# Patient Record
Sex: Female | Born: 1975 | Race: Black or African American | Hispanic: No | Marital: Married | Smoking: Never smoker
Health system: Southern US, Community
[De-identification: ages and names within clinical notes are randomized; demographics above are authoritative.]

## PROBLEM LIST (undated history)

## (undated) DIAGNOSIS — R87619 Unspecified abnormal cytological findings in specimens from cervix uteri: Secondary | ICD-10-CM

## (undated) DIAGNOSIS — D509 Iron deficiency anemia, unspecified: Secondary | ICD-10-CM

## (undated) DIAGNOSIS — IMO0002 Reserved for concepts with insufficient information to code with codable children: Secondary | ICD-10-CM

## (undated) DIAGNOSIS — I10 Essential (primary) hypertension: Secondary | ICD-10-CM

## (undated) DIAGNOSIS — Z8759 Personal history of other complications of pregnancy, childbirth and the puerperium: Secondary | ICD-10-CM

## (undated) HISTORY — PX: ABDOMINAL HYSTERECTOMY: SHX81

## (undated) HISTORY — PX: EYE SURGERY: SHX253

## (undated) HISTORY — PX: TUBAL LIGATION: SHX77

---

## 2001-05-11 ENCOUNTER — Other Ambulatory Visit: Admission: RE | Admit: 2001-05-11 | Discharge: 2001-05-11 | Payer: Self-pay | Admitting: Obstetrics and Gynecology

## 2003-01-14 ENCOUNTER — Emergency Department (HOSPITAL_COMMUNITY): Admission: EM | Admit: 2003-01-14 | Discharge: 2003-01-14 | Payer: Self-pay | Admitting: Emergency Medicine

## 2003-01-14 ENCOUNTER — Encounter: Payer: Self-pay | Admitting: Emergency Medicine

## 2003-01-17 ENCOUNTER — Emergency Department (HOSPITAL_COMMUNITY): Admission: EM | Admit: 2003-01-17 | Discharge: 2003-01-17 | Payer: Self-pay | Admitting: Emergency Medicine

## 2006-08-06 ENCOUNTER — Emergency Department (HOSPITAL_COMMUNITY): Admission: EM | Admit: 2006-08-06 | Discharge: 2006-08-06 | Payer: Self-pay | Admitting: Emergency Medicine

## 2007-09-15 ENCOUNTER — Emergency Department (HOSPITAL_COMMUNITY): Admission: EM | Admit: 2007-09-15 | Discharge: 2007-09-15 | Payer: Self-pay | Admitting: *Deleted

## 2007-10-28 ENCOUNTER — Encounter: Admission: RE | Admit: 2007-10-28 | Discharge: 2007-10-28 | Payer: Self-pay | Admitting: Occupational Medicine

## 2009-03-19 ENCOUNTER — Emergency Department (HOSPITAL_COMMUNITY): Admission: EM | Admit: 2009-03-19 | Discharge: 2009-03-19 | Payer: Self-pay | Admitting: Emergency Medicine

## 2010-03-07 ENCOUNTER — Ambulatory Visit: Payer: Self-pay | Admitting: Nurse Practitioner

## 2010-03-07 ENCOUNTER — Inpatient Hospital Stay (HOSPITAL_COMMUNITY)
Admission: AD | Admit: 2010-03-07 | Discharge: 2010-03-07 | Payer: Self-pay | Source: Home / Self Care | Admitting: Obstetrics and Gynecology

## 2010-07-29 ENCOUNTER — Inpatient Hospital Stay (HOSPITAL_COMMUNITY): Admission: AD | Admit: 2010-07-29 | Discharge: 2010-07-29 | Payer: Self-pay | Admitting: Obstetrics and Gynecology

## 2010-08-31 ENCOUNTER — Observation Stay (HOSPITAL_COMMUNITY)
Admission: AD | Admit: 2010-08-31 | Discharge: 2010-09-02 | Payer: Self-pay | Source: Home / Self Care | Attending: Obstetrics and Gynecology | Admitting: Obstetrics and Gynecology

## 2010-09-08 ENCOUNTER — Inpatient Hospital Stay (HOSPITAL_COMMUNITY)
Admission: AD | Admit: 2010-09-08 | Discharge: 2010-09-08 | Payer: Self-pay | Source: Home / Self Care | Attending: Obstetrics and Gynecology | Admitting: Obstetrics and Gynecology

## 2010-09-16 NOTE — L&D Delivery Note (Signed)
Delivery Note At 4:28 AM a viable female was delivered via Vaginal, Spontaneous Delivery (Presentation: ; Occiput Anterior).  APGAR:8 and 8 ; weight .  pending Placenta status: Intact, Spontaneous.  Cord:  with the following complications:   Anesthesia: None  Episiotomy: None Lacerations: None Suture Repair: n/a Est. Blood Loss (mL): 300 Specimen: cord blood and placenta Mom to AICU on Mag sulfate.  Baby to NICU.  Nikiya Starn C. 06/15/2011, 4:43 AM

## 2010-09-18 ENCOUNTER — Inpatient Hospital Stay (HOSPITAL_COMMUNITY)
Admission: AD | Admit: 2010-09-18 | Discharge: 2010-09-18 | Payer: Self-pay | Source: Home / Self Care | Attending: Obstetrics and Gynecology | Admitting: Obstetrics and Gynecology

## 2010-09-21 ENCOUNTER — Inpatient Hospital Stay (HOSPITAL_COMMUNITY)
Admission: AD | Admit: 2010-09-21 | Discharge: 2010-09-24 | Payer: Self-pay | Source: Home / Self Care | Attending: Obstetrics and Gynecology | Admitting: Obstetrics and Gynecology

## 2010-09-21 ENCOUNTER — Ambulatory Visit (HOSPITAL_COMMUNITY)
Admission: RE | Admit: 2010-09-21 | Discharge: 2010-09-21 | Payer: Self-pay | Source: Home / Self Care | Attending: Obstetrics and Gynecology | Admitting: Obstetrics and Gynecology

## 2010-09-21 LAB — COMPREHENSIVE METABOLIC PANEL
ALT: 14 U/L (ref 0–35)
AST: 22 U/L (ref 0–37)
Albumin: 3 g/dL — ABNORMAL LOW (ref 3.5–5.2)
Alkaline Phosphatase: 140 U/L — ABNORMAL HIGH (ref 39–117)
BUN: 6 mg/dL (ref 6–23)
CO2: 22 mEq/L (ref 19–32)
Calcium: 8.4 mg/dL (ref 8.4–10.5)
Chloride: 108 mEq/L (ref 96–112)
Creatinine, Ser: 0.56 mg/dL (ref 0.4–1.2)
GFR calc Af Amer: >60 mL/min (ref >60)
GFR calc non Af Amer: >60 mL/min (ref >60)
Glucose, Bld: 83 mg/dL (ref 70–99)
Potassium: 3.7 mEq/L (ref 3.5–5.1)
Sodium: 135 mEq/L (ref 135–145)
Total Bilirubin: 1.5 mg/dL — ABNORMAL HIGH (ref 0.3–1.2)
Total Protein: 5.9 g/dL — ABNORMAL LOW (ref 6.0–8.3)

## 2010-09-21 LAB — CBC
HCT: 36.7 % (ref 36.0–46.0)
Hemoglobin: 12.7 g/dL (ref 12.0–15.0)
MCH: 27.5 pg (ref 26.0–34.0)
MCHC: 34.6 g/dL (ref 30.0–36.0)
MCV: 79.4 fL (ref 78.0–100.0)
Platelets: 115 10*3/uL — ABNORMAL LOW (ref 150–400)
RBC: 4.62 MIL/uL (ref 3.87–5.11)
RDW: 14.7 % (ref 11.5–15.5)
WBC: 4.7 10*3/uL (ref 4.0–10.5)

## 2010-09-21 LAB — URIC ACID: Uric Acid, Serum: 5.9 mg/dL (ref 2.4–7.0)

## 2010-09-21 LAB — LACTATE DEHYDROGENASE: LDH: 169 U/L (ref 94–250)

## 2010-10-01 LAB — CBC
HCT: 37.7 % (ref 36.0–46.0)
HCT: 31.3 % — ABNORMAL LOW (ref 36.0–46.0)
HCT: 32.7 % — ABNORMAL LOW (ref 36.0–46.0)
HCT: 38.6 % (ref 36.0–46.0)
Hemoglobin: 13.4 g/dL (ref 12.0–15.0)
Hemoglobin: 10.8 g/dL — ABNORMAL LOW (ref 12.0–15.0)
Hemoglobin: 11.2 g/dL — ABNORMAL LOW (ref 12.0–15.0)
Hemoglobin: 13.3 g/dL (ref 12.0–15.0)
MCH: 28 pg (ref 26.0–34.0)
MCH: 27.3 pg (ref 26.0–34.0)
MCH: 27.3 pg (ref 26.0–34.0)
MCH: 27.4 pg (ref 26.0–34.0)
MCHC: 35.5 g/dL (ref 30.0–36.0)
MCHC: 34.3 g/dL (ref 30.0–36.0)
MCHC: 34.5 g/dL (ref 30.0–36.0)
MCHC: 34.5 g/dL (ref 30.0–36.0)
MCV: 78.7 fL (ref 78.0–100.0)
MCV: 79.3 fL (ref 78.0–100.0)
MCV: 79.4 fL (ref 78.0–100.0)
MCV: 79.8 fL (ref 78.0–100.0)
Platelets: 111 10*3/uL — ABNORMAL LOW (ref 150–400)
Platelets: 117 10*3/uL — ABNORMAL LOW (ref 150–400)
Platelets: 128 10*3/uL — ABNORMAL LOW (ref 150–400)
Platelets: 131 10*3/uL — ABNORMAL LOW (ref 150–400)
RBC: 4.79 MIL/uL (ref 3.87–5.11)
RBC: 3.94 MIL/uL (ref 3.87–5.11)
RBC: 4.1 MIL/uL (ref 3.87–5.11)
RBC: 4.87 MIL/uL (ref 3.87–5.11)
RDW: 14.7 % (ref 11.5–15.5)
RDW: 14.8 % (ref 11.5–15.5)
RDW: 14.9 % (ref 11.5–15.5)
RDW: 14.9 % (ref 11.5–15.5)
WBC: 7 10*3/uL (ref 4.0–10.5)
WBC: 12.6 10*3/uL — ABNORMAL HIGH (ref 4.0–10.5)
WBC: 13.4 10*3/uL — ABNORMAL HIGH (ref 4.0–10.5)
WBC: 8.2 10*3/uL (ref 4.0–10.5)

## 2010-10-01 LAB — COMPREHENSIVE METABOLIC PANEL
ALT: 12 U/L (ref 0–35)
ALT: 11 U/L (ref 0–35)
ALT: 13 U/L (ref 0–35)
AST: 19 U/L (ref 0–37)
AST: 18 U/L (ref 0–37)
AST: 22 U/L (ref 0–37)
Albumin: 2.8 g/dL — ABNORMAL LOW (ref 3.5–5.2)
Albumin: 2.6 g/dL — ABNORMAL LOW (ref 3.5–5.2)
Albumin: 2.8 g/dL — ABNORMAL LOW (ref 3.5–5.2)
Alkaline Phosphatase: 141 U/L — ABNORMAL HIGH (ref 39–117)
Alkaline Phosphatase: 114 U/L (ref 39–117)
Alkaline Phosphatase: 141 U/L — ABNORMAL HIGH (ref 39–117)
BUN: 5 mg/dL — ABNORMAL LOW (ref 6–23)
BUN: 4 mg/dL — ABNORMAL LOW (ref 6–23)
BUN: 5 mg/dL — ABNORMAL LOW (ref 6–23)
CO2: 21 mEq/L (ref 19–32)
CO2: 20 mEq/L (ref 19–32)
CO2: 23 mEq/L (ref 19–32)
Calcium: 7.9 mg/dL — ABNORMAL LOW (ref 8.4–10.5)
Calcium: 7.1 mg/dL — ABNORMAL LOW (ref 8.4–10.5)
Calcium: 7.4 mg/dL — ABNORMAL LOW (ref 8.4–10.5)
Chloride: 107 mEq/L (ref 96–112)
Chloride: 106 mEq/L (ref 96–112)
Chloride: 107 mEq/L (ref 96–112)
Creatinine, Ser: 0.54 mg/dL (ref 0.4–1.2)
Creatinine, Ser: 0.58 mg/dL (ref 0.4–1.2)
Creatinine, Ser: 0.62 mg/dL (ref 0.4–1.2)
GFR calc Af Amer: >60 mL/min (ref >60)
GFR calc Af Amer: >60 mL/min (ref >60)
GFR calc Af Amer: >60 mL/min (ref >60)
GFR calc non Af Amer: >60 mL/min (ref >60)
GFR calc non Af Amer: >60 mL/min (ref >60)
GFR calc non Af Amer: >60 mL/min (ref >60)
Glucose, Bld: 96 mg/dL (ref 70–99)
Glucose, Bld: 121 mg/dL — ABNORMAL HIGH (ref 70–99)
Glucose, Bld: 121 mg/dL — ABNORMAL HIGH (ref 70–99)
Potassium: 3.5 mEq/L (ref 3.5–5.1)
Potassium: 3.6 mEq/L (ref 3.5–5.1)
Potassium: 4.1 mEq/L (ref 3.5–5.1)
Sodium: 134 mEq/L — ABNORMAL LOW (ref 135–145)
Sodium: 133 mEq/L — ABNORMAL LOW (ref 135–145)
Sodium: 134 mEq/L — ABNORMAL LOW (ref 135–145)
Total Bilirubin: 1 mg/dL (ref 0.3–1.2)
Total Bilirubin: 1 mg/dL (ref 0.3–1.2)
Total Bilirubin: 1.3 mg/dL — ABNORMAL HIGH (ref 0.3–1.2)
Total Protein: 5.8 g/dL — ABNORMAL LOW (ref 6.0–8.3)
Total Protein: 4.8 g/dL — ABNORMAL LOW (ref 6.0–8.3)
Total Protein: 6.1 g/dL (ref 6.0–8.3)

## 2010-10-01 LAB — MRSA PCR SCREENING: MRSA by PCR: NEGATIVE

## 2010-10-01 LAB — URIC ACID: Uric Acid, Serum: 5.6 mg/dL (ref 2.4–7.0)

## 2010-10-01 LAB — LACTATE DEHYDROGENASE: LDH: 159 U/L (ref 94–250)

## 2010-10-01 LAB — MAGNESIUM
Magnesium: 4.8 mg/dL — ABNORMAL HIGH (ref 1.5–2.5)
Magnesium: 6.2 mg/dL (ref 1.5–2.5)

## 2010-10-01 LAB — ABO/RH: ABO/RH(D): O POS

## 2010-10-01 LAB — RPR: RPR Ser Ql: NONREACTIVE

## 2010-10-17 ENCOUNTER — Encounter: Payer: Self-pay | Admitting: Obstetrics and Gynecology

## 2010-11-26 LAB — CBC
HCT: 35.1 % — ABNORMAL LOW (ref 36.0–46.0)
HCT: 36.5 % (ref 36.0–46.0)
HCT: 37.3 % (ref 36.0–46.0)
Hemoglobin: 12.2 g/dL (ref 12.0–15.0)
Hemoglobin: 12.4 g/dL (ref 12.0–15.0)
MCH: 27 pg (ref 26.0–34.0)
MCH: 27.3 pg (ref 26.0–34.0)
MCHC: 33.8 g/dL (ref 30.0–36.0)
MCHC: 34.8 g/dL (ref 30.0–36.0)
MCV: 78.5 fL (ref 78.0–100.0)
Platelets: 140 10*3/uL — ABNORMAL LOW (ref 150–400)
Platelets: 149 10*3/uL — ABNORMAL LOW (ref 150–400)
RBC: 4.47 MIL/uL (ref 3.87–5.11)
RBC: 4.6 MIL/uL (ref 3.87–5.11)
RDW: 13.9 % (ref 11.5–15.5)
RDW: 14.3 % (ref 11.5–15.5)
WBC: 4.8 10*3/uL (ref 4.0–10.5)
WBC: 5.5 10*3/uL (ref 4.0–10.5)

## 2010-11-26 LAB — URINE MICROSCOPIC-ADD ON

## 2010-11-26 LAB — COMPREHENSIVE METABOLIC PANEL
ALT: 11 U/L (ref 0–35)
ALT: 13 U/L (ref 0–35)
ALT: 14 U/L (ref 0–35)
AST: 19 U/L (ref 0–37)
AST: 19 U/L (ref 0–37)
Albumin: 3 g/dL — ABNORMAL LOW (ref 3.5–5.2)
Albumin: 3.1 g/dL — ABNORMAL LOW (ref 3.5–5.2)
Alkaline Phosphatase: 80 U/L (ref 39–117)
Alkaline Phosphatase: 92 U/L (ref 39–117)
BUN: 8 mg/dL (ref 6–23)
CO2: 20 mEq/L (ref 19–32)
CO2: 23 mEq/L (ref 19–32)
Calcium: 8.6 mg/dL (ref 8.4–10.5)
Calcium: 8.9 mg/dL (ref 8.4–10.5)
Chloride: 105 mEq/L (ref 96–112)
Chloride: 107 mEq/L (ref 96–112)
Creatinine, Ser: 0.48 mg/dL (ref 0.4–1.2)
GFR calc Af Amer: >60 mL/min (ref >60)
GFR calc Af Amer: >60 mL/min (ref >60)
GFR calc Af Amer: >60 mL/min (ref >60)
GFR calc non Af Amer: >60 mL/min (ref >60)
GFR calc non Af Amer: >60 mL/min (ref >60)
Glucose, Bld: 77 mg/dL (ref 70–99)
Glucose, Bld: 84 mg/dL (ref 70–99)
Glucose, Bld: 95 mg/dL (ref 70–99)
Potassium: 3.4 mEq/L — ABNORMAL LOW (ref 3.5–5.1)
Potassium: 3.7 mEq/L (ref 3.5–5.1)
Sodium: 134 mEq/L — ABNORMAL LOW (ref 135–145)
Sodium: 136 mEq/L (ref 135–145)
Sodium: 136 mEq/L (ref 135–145)
Total Bilirubin: 1.1 mg/dL (ref 0.3–1.2)
Total Bilirubin: 1.2 mg/dL (ref 0.3–1.2)
Total Protein: 6.4 g/dL (ref 6.0–8.3)
Total Protein: 6.6 g/dL (ref 6.0–8.3)

## 2010-11-26 LAB — URINALYSIS, ROUTINE W REFLEX MICROSCOPIC
Glucose, UA: NEGATIVE mg/dL
Protein, ur: 100 mg/dL — AB
Specific Gravity, Urine: >1.03 — ABNORMAL HIGH (ref 1.005–1.030)
pH: 6.5 (ref 5.0–8.0)

## 2010-11-26 LAB — URIC ACID
Uric Acid, Serum: 4.5 mg/dL (ref 2.4–7.0)
Uric Acid, Serum: 5.3 mg/dL (ref 2.4–7.0)

## 2010-11-26 LAB — CREATININE CLEARANCE, URINE, 24 HOUR
Creatinine, 24H Ur: 1207 mg/d (ref 700–1800)
Creatinine, Urine: 150.86 mg/dL
Creatinine: 0.48 mg/dL (ref 0.4–1.2)
Urine Total Volume-CRCL: 800 mL

## 2010-11-26 LAB — PROTEIN, URINE, 24 HOUR: Collection Interval-UPROT: 24 hours

## 2010-11-26 LAB — LACTATE DEHYDROGENASE
LDH: 144 U/L (ref 94–250)
LDH: 153 U/L (ref 94–250)

## 2010-11-27 LAB — COMPREHENSIVE METABOLIC PANEL
Albumin: 3.2 g/dL — ABNORMAL LOW (ref 3.5–5.2)
BUN: 4 mg/dL — ABNORMAL LOW (ref 6–23)
Calcium: 9.1 mg/dL (ref 8.4–10.5)
Creatinine, Ser: 0.36 mg/dL — ABNORMAL LOW (ref 0.4–1.2)
Total Bilirubin: 0.8 mg/dL (ref 0.3–1.2)
Total Protein: 6.2 g/dL (ref 6.0–8.3)

## 2010-11-27 LAB — CBC
MCH: 28.1 pg (ref 26.0–34.0)
MCHC: 33.2 g/dL (ref 30.0–36.0)
MCV: 84.6 fL (ref 78.0–100.0)
Platelets: 162 10*3/uL (ref 150–400)
RDW: 14.5 % (ref 11.5–15.5)

## 2010-11-27 LAB — URINALYSIS, ROUTINE W REFLEX MICROSCOPIC
Glucose, UA: NEGATIVE mg/dL
Hgb urine dipstick: NEGATIVE
Specific Gravity, Urine: 1.025 (ref 1.005–1.030)
Urobilinogen, UA: 2 mg/dL — ABNORMAL HIGH (ref 0.0–1.0)
pH: 6 (ref 5.0–8.0)

## 2010-11-27 LAB — URINE MICROSCOPIC-ADD ON

## 2010-11-27 LAB — URINE CULTURE: Colony Count: 25000

## 2010-12-02 LAB — URINALYSIS, ROUTINE W REFLEX MICROSCOPIC
Glucose, UA: NEGATIVE mg/dL
Protein, ur: NEGATIVE mg/dL
Urobilinogen, UA: 1 mg/dL (ref 0.0–1.0)

## 2010-12-02 LAB — CBC
HCT: 34.9 % — ABNORMAL LOW (ref 36.0–46.0)
MCV: 81.9 fL (ref 78.0–100.0)
RBC: 4.26 MIL/uL (ref 3.87–5.11)
WBC: 5.2 10*3/uL (ref 4.0–10.5)

## 2010-12-02 LAB — POCT PREGNANCY, URINE: Preg Test, Ur: POSITIVE

## 2010-12-23 LAB — URINALYSIS, ROUTINE W REFLEX MICROSCOPIC
Bilirubin Urine: NEGATIVE
Glucose, UA: NEGATIVE mg/dL
Hgb urine dipstick: NEGATIVE
Ketones, ur: NEGATIVE mg/dL
Nitrite: NEGATIVE
Specific Gravity, Urine: 1.021 (ref 1.005–1.030)
pH: 8 (ref 5.0–8.0)

## 2010-12-23 LAB — URINE MICROSCOPIC-ADD ON

## 2010-12-23 LAB — PREGNANCY, URINE: Preg Test, Ur: NEGATIVE

## 2011-01-24 ENCOUNTER — Other Ambulatory Visit (HOSPITAL_COMMUNITY): Payer: Self-pay | Admitting: Obstetrics and Gynecology

## 2011-01-24 ENCOUNTER — Ambulatory Visit (HOSPITAL_COMMUNITY)
Admission: RE | Admit: 2011-01-24 | Discharge: 2011-01-24 | Disposition: A | Discharge status: Home / Self Care | Payer: BC Managed Care – PPO | Source: Ambulatory Visit | Attending: Obstetrics and Gynecology | Admitting: Obstetrics and Gynecology

## 2011-01-24 DIAGNOSIS — O3680X0 Pregnancy with inconclusive fetal viability, not applicable or unspecified: Secondary | ICD-10-CM

## 2011-01-24 DIAGNOSIS — Z3689 Encounter for other specified antenatal screening: Secondary | ICD-10-CM | POA: Insufficient documentation

## 2011-02-18 LAB — RUBELLA ANTIBODY, IGM: Rubella: IMMUNE

## 2011-02-18 LAB — HIV ANTIBODY (ROUTINE TESTING W REFLEX): HIV: NONREACTIVE

## 2011-02-18 LAB — ABO/RH

## 2011-06-13 ENCOUNTER — Ambulatory Visit (HOSPITAL_COMMUNITY)
Admit: 2011-06-13 | Discharge: 2011-06-13 | Disposition: A | Discharge status: Home / Self Care | Payer: BC Managed Care – PPO | Attending: Advanced Practice Midwife | Admitting: Advanced Practice Midwife

## 2011-06-13 ENCOUNTER — Encounter (HOSPITAL_COMMUNITY): Payer: Self-pay | Admitting: *Deleted

## 2011-06-13 ENCOUNTER — Inpatient Hospital Stay (HOSPITAL_COMMUNITY)
Admission: AD | Admit: 2011-06-13 | Discharge: 2011-06-17 | DRG: 652 | Disposition: A | Discharge status: Home / Self Care | Payer: BC Managed Care – PPO | Source: Ambulatory Visit | Attending: Obstetrics and Gynecology | Admitting: Obstetrics and Gynecology

## 2011-06-13 ENCOUNTER — Inpatient Hospital Stay (HOSPITAL_COMMUNITY): Payer: BC Managed Care – PPO

## 2011-06-13 DIAGNOSIS — O1490 Unspecified pre-eclampsia, unspecified trimester: Secondary | ICD-10-CM

## 2011-06-13 DIAGNOSIS — IMO0002 Reserved for concepts with insufficient information to code with codable children: Principal | ICD-10-CM | POA: Diagnosis present

## 2011-06-13 DIAGNOSIS — Z302 Encounter for sterilization: Secondary | ICD-10-CM

## 2011-06-13 DIAGNOSIS — O1410 Severe pre-eclampsia, unspecified trimester: Secondary | ICD-10-CM

## 2011-06-13 DIAGNOSIS — I10 Essential (primary) hypertension: Secondary | ICD-10-CM | POA: Diagnosis present

## 2011-06-13 DIAGNOSIS — O26899 Other specified pregnancy related conditions, unspecified trimester: Secondary | ICD-10-CM

## 2011-06-13 DIAGNOSIS — O169 Unspecified maternal hypertension, unspecified trimester: Secondary | ICD-10-CM

## 2011-06-13 DIAGNOSIS — E669 Obesity, unspecified: Secondary | ICD-10-CM

## 2011-06-13 HISTORY — DX: Essential (primary) hypertension: I10

## 2011-06-13 HISTORY — DX: Reserved for concepts with insufficient information to code with codable children: IMO0002

## 2011-06-13 HISTORY — DX: Unspecified abnormal cytological findings in specimens from cervix uteri: R87.619

## 2011-06-13 LAB — URINE MICROSCOPIC-ADD ON

## 2011-06-13 LAB — PROTEIN / CREATININE RATIO, URINE
Creatinine, Urine: 97.64 mg/dL
Protein Creatinine Ratio: 5.37 — ABNORMAL HIGH (ref 0.00–0.15)
Total Protein, Urine: 524.4 mg/dL

## 2011-06-13 LAB — URINALYSIS, ROUTINE W REFLEX MICROSCOPIC
Glucose, UA: NEGATIVE mg/dL
Specific Gravity, Urine: 1.01 (ref 1.005–1.030)

## 2011-06-13 LAB — CBC
MCH: 27.7 pg (ref 26.0–34.0)
MCV: 78.3 fL (ref 78.0–100.0)
Platelets: 95 10*3/uL — ABNORMAL LOW (ref 150–400)
Platelets: 113 10*3/uL — ABNORMAL LOW (ref 150–400)
RBC: 4.38 MIL/uL (ref 3.87–5.11)
RDW: 13.3 % (ref 11.5–15.5)
WBC: 5.3 10*3/uL (ref 4.0–10.5)
WBC: 5.7 10*3/uL (ref 4.0–10.5)

## 2011-06-13 LAB — COMPREHENSIVE METABOLIC PANEL
ALT: 14 U/L (ref 0–35)
AST: 27 U/L (ref 0–37)
AST: 23 U/L (ref 0–37)
Albumin: 2.2 g/dL — ABNORMAL LOW (ref 3.5–5.2)
Albumin: 2.3 g/dL — ABNORMAL LOW (ref 3.5–5.2)
Calcium: 7.8 mg/dL — ABNORMAL LOW (ref 8.4–10.5)
Calcium: 8.3 mg/dL — ABNORMAL LOW (ref 8.4–10.5)
Chloride: 102 mEq/L (ref 96–112)
Creatinine, Ser: 0.73 mg/dL (ref 0.50–1.10)
Creatinine, Ser: 0.7 mg/dL (ref 0.50–1.10)
GFR calc non Af Amer: >60 mL/min (ref >60)
Sodium: 136 mEq/L (ref 135–145)
Total Protein: 5.5 g/dL — ABNORMAL LOW (ref 6.0–8.3)

## 2011-06-13 MED ORDER — DOCUSATE SODIUM 100 MG PO CAPS
100.0000 mg | ORAL_CAPSULE | Freq: Every day | ORAL | Status: DC
  Filled 2011-06-13 (×3): qty 1

## 2011-06-13 MED ORDER — ZOLPIDEM TARTRATE 10 MG PO TABS: 10.0000 mg | ORAL_TABLET | Freq: Every evening | ORAL | Status: DC | PRN

## 2011-06-13 MED ORDER — MAGNESIUM SULFATE 40 G IN LACTATED RINGERS - SIMPLE
2.0000 g/h | INTRAVENOUS | Status: DC
  Administered 2011-06-13: 2 g/h via INTRAVENOUS
  Filled 2011-06-13 (×2): qty 500

## 2011-06-13 MED ORDER — PANTOPRAZOLE SODIUM 40 MG IV SOLR
40.0000 mg | Freq: Every day | INTRAVENOUS | Status: DC
  Administered 2011-06-13 – 2011-06-14 (×2): 40 mg via INTRAVENOUS
  Filled 2011-06-13 (×3): qty 40

## 2011-06-13 MED ORDER — LABETALOL HCL 200 MG PO TABS
400.0000 mg | ORAL_TABLET | Freq: Three times a day (TID) | ORAL | Status: DC
  Administered 2011-06-13 – 2011-06-16 (×9): 400 mg via ORAL
  Filled 2011-06-13 (×15): qty 2

## 2011-06-13 MED ORDER — ONDANSETRON HCL 4 MG/2ML IJ SOLN
4.0000 mg | Freq: Four times a day (QID) | INTRAMUSCULAR | Status: DC | PRN
  Administered 2011-06-13 – 2011-06-14 (×3): 4 mg via INTRAVENOUS
  Filled 2011-06-13 (×4): qty 2

## 2011-06-13 MED ORDER — MAGNESIUM SULFATE 40 G IN LACTATED RINGERS - SIMPLE
INTRAVENOUS | Status: AC
  Filled 2011-06-13: qty 500

## 2011-06-13 MED ORDER — OXYCODONE-ACETAMINOPHEN 5-325 MG PO TABS
2.0000 | ORAL_TABLET | ORAL | Status: DC | PRN
  Administered 2011-06-13: 2 via ORAL
  Filled 2011-06-13: qty 2

## 2011-06-13 MED ORDER — LABETALOL HCL 5 MG/ML IV SOLN
40.0000 mg | Freq: Once | INTRAVENOUS | Status: AC
  Administered 2011-06-13: 40 mg via INTRAVENOUS
  Filled 2011-06-13: qty 8

## 2011-06-13 MED ORDER — LACTATED RINGERS IV SOLN
INTRAVENOUS | Status: DC
  Administered 2011-06-13: 03:00:00 via INTRAVENOUS

## 2011-06-13 MED ORDER — HYDRALAZINE HCL 20 MG/ML IJ SOLN
INTRAMUSCULAR | Status: AC
  Filled 2011-06-13: qty 1

## 2011-06-13 MED ORDER — LACTATED RINGERS IV SOLN: INTRAVENOUS | Status: DC

## 2011-06-13 MED ORDER — ACETAMINOPHEN 325 MG PO TABS
650.0000 mg | ORAL_TABLET | ORAL | Status: DC | PRN
  Administered 2011-06-13: 650 mg via ORAL
  Filled 2011-06-13 (×2): qty 2

## 2011-06-13 MED ORDER — CALCIUM CARBONATE ANTACID 500 MG PO CHEW
2.0000 | CHEWABLE_TABLET | ORAL | Status: DC | PRN
  Administered 2011-06-13: 400 mg via ORAL
  Filled 2011-06-13 (×2): qty 2

## 2011-06-13 MED ORDER — BETAMETHASONE SOD PHOS & ACET 6 (3-3) MG/ML IJ SUSP
12.0000 mg | INTRAMUSCULAR | Status: AC
  Administered 2011-06-13 – 2011-06-14 (×2): 12 mg via INTRAMUSCULAR
  Filled 2011-06-13 (×3): qty 2

## 2011-06-13 MED ORDER — CALCIUM CARBONATE ANTACID 500 MG PO CHEW
1.0000 | CHEWABLE_TABLET | Freq: Once | ORAL | Status: AC
  Administered 2011-06-13: 200 mg via ORAL
  Filled 2011-06-13: qty 1

## 2011-06-13 MED ORDER — HYDRALAZINE HCL 20 MG/ML IJ SOLN
10.0000 mg | Freq: Once | INTRAMUSCULAR | Status: AC
  Administered 2011-06-13: 18:00:00 via INTRAVENOUS

## 2011-06-13 MED ORDER — HYDRALAZINE HCL 20 MG/ML IJ SOLN
10.0000 mg | Freq: Once | INTRAMUSCULAR | Status: AC
  Administered 2011-06-13: 10 mg via INTRAVENOUS
  Filled 2011-06-13: qty 1

## 2011-06-13 MED ORDER — LABETALOL HCL 5 MG/ML IV SOLN
40.0000 mg | Freq: Once | INTRAVENOUS | Status: AC
  Administered 2011-06-13: 40 mg via INTRAVENOUS

## 2011-06-13 MED ORDER — MAGNESIUM SULFATE BOLUS VIA INFUSION
4.0000 g | Freq: Once | INTRAVENOUS | Status: AC
  Administered 2011-06-13: 4 g via INTRAVENOUS
  Filled 2011-06-13 (×2): qty 500

## 2011-06-13 MED ORDER — LABETALOL HCL 5 MG/ML IV SOLN
INTRAVENOUS | Status: AC
  Filled 2011-06-13: qty 8

## 2011-06-13 MED ORDER — LABETALOL HCL 200 MG PO TABS
400.0000 mg | ORAL_TABLET | Freq: Two times a day (BID) | ORAL | Status: DC
  Administered 2011-06-13: 400 mg via ORAL
  Filled 2011-06-13: qty 2

## 2011-06-13 NOTE — Progress Notes (Signed)
SAYS AT 2350 WAS ON TOILET - FEELING PRESSURE .    AND FEEELS   A LOT CRAMPS.    TOOK BP AT HOME - SAYS ELEVATED. Marland Kitchen  STILL FEELS SAME AS AT HOME.

## 2011-06-13 NOTE — Progress Notes (Signed)
Received from MAU via gurney with magnesium sulfate 4 gm load infusing.

## 2011-06-13 NOTE — Consult Note (Addendum)
MATERNAL FETAL MEDICINE CONSULT  Patient Name: Erika Eaton Medical Record Number:  [id number] Date of Birth: 09-26-1975 Requesting Physician Name:  Tilda Burrow, MD Date of Service: 06/13/2011  Chief Complaint Elevated BP in setting of chronic hypertension  History of Present Illness [name] was seen today for prenatal diagnosis secondary to increased blood pressure at the request of Dr. Emelda Fear.  The patient is a 35 y.o. [id number] at [redacted]w[redacted]d with an EDD of 08/30/2011 who was admitted to the hospital early this morning due to severely elevated BP and headache.  She developed hypertension during her last pregnancy and it has been a persistent problem since then.  Prior to admission she was taking oral labetalol 400 mg by mouth twice daily.  As mentioned she complained of a headache at the time of admission, but that has not ceased.  She has not had visual changes, RUQ pain, or an acute worsening of peripheral edema.  Her fetus is active.  She denies vaginal bleeding, loss of fluid, or contractions.  She developed superimposed preeclampsia in her most recent pregnancy which required delivery at 36 weeks of gestation.  Her first two pregnancies were uncomplicated.  Review of Systems A comprehensive review of systems was negative.  Patient History OB History    Grav Para Term Preterm Abortions TAB SAB Ect Mult Living   4 3 2 1      3      # Outc Date GA Lbr Len/2nd Wgt Sex Del Anes PTL Lv   1 TRM            2 TRM            3 PRE            4 CUR               Past Medical History  Diagnosis Date  . Hypertension   . Abnormal Pap smear     Past Surgical History  Procedure Date  . Eye surgery   . Tubal ligation     History   Social History  . Marital Status: Single    Spouse Name: N/A    Number of Children: N/A  . Years of Education: N/A   Social History Main Topics  . Smoking status: Never Smoker   . Smokeless tobacco: None  . Alcohol Use: No  . Drug  Use: No  . Sexually Active: Yes    Birth Control/ Protection: Other-see comments     Tubal Lilgation   Other Topics Concern  . None   Social History Narrative  . None   The patient has no family history of mental retardation, birth defects, or genetic diseases.  Physical Examination Filed Vitals:   06/13/11 1309 06/13/11 1314 06/13/11 1319 06/13/11 1320  BP:      Pulse: 77 85 83 83  Temp:      TempSrc:      Resp:      Height:      Weight:      SpO2: 94% 97% 98% 98%  BP 123-198/81-138; most recently 123/81 General appearance - alert, well appearing, and in no distress Abdomen - soft, nontender, nondistended, no masses or organomegaly, uterus gravid.  Assessment and Recommendations Elevated BP possible superimposed preeclampsia:  At this time the patient's 24 hour urine collection is pending.  This will be crucial to distinguish between a simple worsening of her chronic hypertension vs. new-onset superimposed preeclampisa.  However, it may be difficult  to interpret as the patient did not have a baseline 24 hour urine collection in the beginning of pregnancy, making it difficult to determine if any proteinuria is a chronic or acute problem.  If her 24 hour urine protein collection shows less than 300 mg of protein this would indicate the patient does not have preeclampsia.  In that circumstance, the patient's oral labetalol can be increased to increase BP control to have it consistently less than 160/90.  If necessary a second agent could be added if the maximum dose labetalol proves ineffective.  If oral medications succeed in controlling her BP, resumption of outpatient prenatal care with twice weekly fetal testing is appropriate.  If the patient's 24 hour urine protein is greater than 300 mg she should be assumed to have superimposed preeclampsia.  As the patient's fetus is normally grown and all of her remaining laboratory studies are normal she would be a candidate for expectant  management of superimposed preeclampsia.  In this scenario the patient should remain hospitalized and BP control attempted with oral medications as described above.  She should have daily fetal testing, twice weekly umbilical artery doppler assessment, and ultrasounds for growth every 3 weeks.  Delivery should be effected at 34 weeks or earlier if evidence of fetal compromise, uncontrolled hypertension, laboratory abnormalities, or patient symptoms appear.  However, expectant management of superimposed preeclampsia does carry a risk of maternal complication, such as heart attack, stroke, or seizure.  Thus, if the patient does not wish to take on these risks, if the patient is confirmed to have preeclampsia, delivery could be effected 24 hours after her course of betamethasone is complete.  I spent 30 minutes with Ms. McSwain today of which 50% was face-to-face counseling.   Rema Fendt

## 2011-06-13 NOTE — Progress Notes (Signed)
HAD VAG DEL IN JAN- AND BTL.  HAD PRE-ECLAMPSIA TOO.

## 2011-06-13 NOTE — Progress Notes (Signed)
FACULTY PRACTICE ANTEPARTUM(COMPREHENSIVE) NOTE  Erika Eaton is a 35 y.o. [id number] at [redacted]w[redacted]d by LMP and confirmatory u/s at [redacted]w[redacted]d on 01/24/2011 who is admitted for Chronic Htn, plus superimposed preeclampsia.  Pt was in process of Transferring care from Drs Bovard/Meisinger, had an initial visit scheduled this Friday, and presented tonight due to headache, and admitted. Pt has been given Labetolol 40 mg iv x 2, Labetolol 400 mg p.o.x 1, and begun on Mg Sulfate. Fetal presentation is unsure. Length of Stay:  0  Days Prior Ob hx;  Last delivery jan 2012 at 36 weeks for chtn with superimposed pre-E, followed by tubal ligation, pt conceived after lmp 11/23/10. Pt reports being on TWO BP meds after pregnancy, Labetolol BID and Nifedipine, and upon onset of pregnancy BP's initially low enough that Nifedipine discontinued and Labetolol reduced to 200 mg ONCE Daily.  Record to be requested Subjective:Admit Hx reviewed  P Patient denies RUQ pain, Headache rated 8/10 frontal, with accompanying blurry vision. No vision loss Patient reports the fetal movement as active. Patient reports uterine contraction  activity as none. Patient reports  vaginal bleeding as none. Patient describes fluid per vagina as None.  Vitals:  Blood pressure 174/126, pulse 82, temperature 97.4 F (36.3 C), temperature source Oral, resp. rate 18, height 5\' 4"  (1.626 m), weight 92.25 kg (203 lb 6 oz). Physical Examination:  General appearance - alert, well appearing, and in no distress and obvious periorbital edema Heart - normal rate and regular rhythm Abdomen - soft, nontender, nondistended Fundal Height:  size equals dates  Extremities: extremities normal, atraumatic, no cyanosis or edema and Homans sign is negative, no sign of DVT with DTRs 3+ bilaterally without clonus  Membranes:intact  Fetal Monitoring:  Baseline: 140 bpm, Variability: Fair (1-6 bpm) and Decelerations: Absent  Labs:  Recent Results (from the past 24  hour(s))  URINALYSIS, ROUTINE W REFLEX MICROSCOPIC   Collection Time   06/13/11  2:00 AM      Component Value Range   Color, Urine YELLOW  YELLOW    Appearance CLEAR  CLEAR    Specific Gravity, Urine 1.010  1.005 - 1.030    pH 6.5  5.0 - 8.0    Glucose, UA NEGATIVE  NEGATIVE (mg/dL)   Hgb urine dipstick MODERATE (*) NEGATIVE    Bilirubin Urine NEGATIVE  NEGATIVE    Ketones, ur NEGATIVE  NEGATIVE (mg/dL)   Protein, ur >454 (*) NEGATIVE (mg/dL)   Urobilinogen, UA 0.2  0.0 - 1.0 (mg/dL)   Nitrite NEGATIVE  NEGATIVE    Leukocytes, UA NEGATIVE  NEGATIVE   PROTEIN / CREATININE RATIO, URINE   Collection Time   06/13/11  2:00 AM      Component Value Range   Creatinine, Urine 97.64     Total Protein, Urine PENDING     PROTEIN CREATININE RATIO PENDING  0.00 - 0.15   URINE MICROSCOPIC-ADD ON   Collection Time   06/13/11  2:00 AM      Component Value Range   Squamous Epithelial / LPF RARE  RARE    WBC, UA 3-6  <3 (WBC/hpf)   RBC / HPF 3-6  <3 (RBC/hpf)   Urine-Other MUCOUS PRESENT    CBC   Collection Time   06/13/11  3:00 AM      Component Value Range   WBC 5.3  4.0 - 10.5 (K/uL)   RBC 4.37  3.87 - 5.11 (MIL/uL)   Hemoglobin 12.1  12.0 - 15.0 (g/dL)   HCT  34.2 (*) 36.0 - 46.0 (%)   MCV 78.3  78.0 - 100.0 (fL)   MCH 27.7  26.0 - 34.0 (pg)   MCHC 35.4  30.0 - 36.0 (g/dL)   RDW 16.1  09.6 - 04.5 (%)   Platelets 95 (*) 150 - 400 (K/uL)  COMPREHENSIVE METABOLIC PANEL   Collection Time   06/13/11  3:00 AM      Component Value Range   Sodium 136  135 - 145 (mEq/L)   Potassium 3.0 (*) 3.5 - 5.1 (mEq/L)   Chloride 102  96 - 112 (mEq/L)   CO2 23  19 - 32 (mEq/L)   Glucose, Bld 92  70 - 99 (mg/dL)   BUN 13  6 - 23 (mg/dL)   Creatinine, Ser 4.09  0.50 - 1.10 (mg/dL)   Calcium 7.8 (*) 8.4 - 10.5 (mg/dL)   Total Protein 5.0 (*) 6.0 - 8.3 (g/dL)   Albumin 2.2 (*) 3.5 - 5.2 (g/dL)   AST 27  0 - 37 (U/L)   ALT 14  0 - 35 (U/L)   Alkaline Phosphatase 97  39 - 117 (U/L)   Total Bilirubin  1.0  0.3 - 1.2 (mg/dL)   GFR calc non Af Amer >60  >60 (mL/min)   GFR calc Af Amer >60  >60 (mL/min)      Medications:  Scheduled    . betamethasone acetate-betamethasone sodium phosphate  12 mg Intramuscular Q24H  . calcium carbonate  1 tablet Oral Once  . docusate sodium  100 mg Oral Daily  . labetalol  400 mg Oral BID  . labetalol  40 mg Intravenous Once  . labetalol  40 mg Intravenous Once  . magnesium  4 g Intravenous Once  . DISCONTD: magnesium sulfate 40 grams in LR 500 mL       I have reviewed the patient's current medications.  ASSESSMENT: IUP 28w 6d Chronic Htn with superimposed preeclampsia S/p tubal ligation with contraceptive failure Hx prior Preterm del for CHTN,superimposed preE  There is no problem list on file for this patient.   PLAN: Admit, Iv MgSulfate 4gm then 2 gm/hr, Labetolol titrate to goal bp Betamethasone, recheck labs noon today then daily MFM consult, NICU consult, Detail u/s this am thru MFM Request records today from prior prenatal care   Klee Kolek V 06/13/2011,6:17 AM

## 2011-06-13 NOTE — H&P (Signed)
HPI  [name] is a 35 year old G4P2103 at [redacted]w[redacted]d who presents with headache and elevated blood pressure. Was previously followed by Dr. Ellyn Hack, but [facility]. States she has a history of gestational hypertension and pre-eclampsia, but no blood pressure issues when not pregnant. She is on labatelol 200mg  daily. Has been on labetalol since her previous pregnancy which was delivered at 35 weeks for hypertension/pre-eclampsia. A headache started around 10pm last night and she had her daughter check her blood pressure, it was elevated between 180-202/120-140. The headache is an 8/10; it was slightly improved with tylenol. States has had some edema in her hands. Denies vision changes, RUQ pain. Complaining of some pelvic pressure, but no contractions. Denies loss of fluid and vaginal bleeding. Reports good fetal movement. She is s/p BTL in 09/2010.  OB History    Grav  Para  Term  Preterm  Abortions  TAB  SAB  Ect  Mult  Living    4  3  2  1       3       Past Medical History   Diagnosis  Date   .  Hypertension    .  Abnormal Pap smear     Past Surgical History   Procedure  Date   .  Eye surgery    .  Tubal ligation     No family history on file.  History   Substance Use Topics   .  Smoking status:  Never Smoker   .  Smokeless tobacco:  Not on file   .  Alcohol Use:  No    Allergies:  Allergies   Allergen  Reactions   .  Chocolate  Rash   .  Soda Lime  Rash    Prescriptions prior to admission   Medication  Sig  Dispense  Refill   .  acetaminophen (TYLENOL) 500 MG tablet  Take 500 mg by mouth every 6 (six) hours as needed. For pain     .  labetalol (NORMODYNE) 200 MG tablet  Take 200 mg by mouth daily.      Review of Systems  Constitutional: Negative for fever and chills.  Eyes: Negative for blurred vision and double vision.  Respiratory: Negative for cough and shortness of breath.  Cardiovascular: Negative for chest pain and palpitations.    Gastrointestinal: Negative for nausea, vomiting and diarrhea.  Musculoskeletal:  Hand and leg edema  Skin: Negative for rash.  Neurological: Positive for headaches.   Physical Exam   Blood pressure 178/124, pulse 76, temperature 97.4 F (36.3 C), temperature source Oral, resp. rate 18, height 5\' 4"  (1.626 m), weight 203 lb 6 oz (92.25 kg).  Physical Exam  Constitutional: She is oriented to person, place, and time. She appears well-developed and well-nourished. No distress.  HENT: c/o headache DTR's 2+; 2+ pedal edema Head: Normocephalic and atraumatic.  Mouth/Throat: Oropharynx is clear and moist.  Eyes: No scleral icterus.  Neck: Normal range of motion. Neck supple.  Cardiovascular: Normal rate, regular rhythm, normal heart sounds and intact distal pulses. Exam reveals no gallop.  No murmur heard.  Respiratory: Effort normal and breath sounds normal. She has no wheezes. She has no rales.  GI:  Mild epigastric tenderness to palpation. Gravid with size consistent with dates  Musculoskeletal: She exhibits edema.  Neurological: She is alert and oriented to person, place, and time.  Skin: Skin is warm and dry. No rash noted.  Psychiatric: She has a  normal mood and affect. Her behavior is normal.   Dilation: 1  Effacement (%): Thick  Cervical Position: Posterior  Exam by:: Dr Elwyn Reach  FHT: 145, minimal to moderate variability, no accels, no decels  MAU Course   Procedures  CBC    Component  Value  Date/Time    WBC  5.3  06/13/2011 0300    RBC  4.37  06/13/2011 0300    HGB  12.1  06/13/2011 0300    HCT  34.2*  06/13/2011 0300    PLT  95*  06/13/2011 0300    MCV  78.3  06/13/2011 0300    MCH  27.7  06/13/2011 0300    MCHC  35.4  06/13/2011 0300    RDW  13.3  06/13/2011 0300    CMP    Component  Value  Date/Time    NA  136  06/13/2011 0300    K  3.0*  06/13/2011 0300    CL  102  06/13/2011 0300    CO2  23  06/13/2011 0300    GLUCOSE  92  06/13/2011 0300    BUN  13  06/13/2011 0300     CREATININE  0.73  06/13/2011 0300    CREATININE  .48  09/02/2010 0137    CALCIUM  7.8*  06/13/2011 0300    PROT  5.0*  06/13/2011 0300    ALBUMIN  2.2*  06/13/2011 0300    AST  27  06/13/2011 0300    ALT  14  06/13/2011 0300    ALKPHOS  97  06/13/2011 0300    BILITOT  1.0  06/13/2011 0300    GFRNONAA  >60  06/13/2011 0300    GFRAA  >60  06/13/2011 0300    Urine dipstick shows negative for all components, positive for protein (>300).  Urine Pr/Cr pending  MDM  Will give labetalol IV 40mg  x1 and obtain PIH labs  BP came down to 146/96 after second dose of labetalol 40mg  IV.  Assessment and Plan   35 year old G4P2103 at 28.6 with severe range hypertension, >300 protein on UA, and platelets <100, concerning for severe pre-eclampsia.  -start Mg  -Admit to antenatal  Consult with Dr. Emelda Fear, agrees with POC BOOTH, ERIN  06/13/2011, 2:56 AM

## 2011-06-13 NOTE — ED Provider Notes (Signed)
History     No chief complaint on file.  HPI [name] is a 35 year old G4P2103 at [redacted]w[redacted]d who presents with headache and elevated blood pressure.  Was previously followed by Dr. Ellyn Hack, but [facility].  States she has a history of gestational hypertension and pre-eclampsia, but no blood pressure issues when not pregnant.  She is on labatelol 200mg  daily. Has been on labetalol since her previous pregnancy which was delivered at 35 weeks for hypertension/pre-eclampsia.  A headache started around 10pm last night and she had her daughter check her blood pressure, it was elevated between 180-202/120-140.  The headache is an 8/10; it was slightly improved with tylenol.  States has had some edema in her hands.  Denies vision changes, RUQ pain.  Complaining of some pelvic pressure, but no contractions.  Denies loss of fluid and vaginal bleeding.  Reports good fetal movement.  She is s/p BTL in 09/2010.  OB History    Grav Para Term Preterm Abortions TAB SAB Ect Mult Living   4 3 2 1      3       Past Medical History  Diagnosis Date  . Hypertension   . Abnormal Pap smear     Past Surgical History  Procedure Date  . Eye surgery   . Tubal ligation     No family history on file.  History  Substance Use Topics  . Smoking status: Never Smoker   . Smokeless tobacco: Not on file  . Alcohol Use: No    Allergies:  Allergies  Allergen Reactions  . Chocolate Rash  . Soda Lime Rash    Prescriptions prior to admission  Medication Sig Dispense Refill  . acetaminophen (TYLENOL) 500 MG tablet Take 500 mg by mouth every 6 (six) hours as needed. For pain       . labetalol (NORMODYNE) 200 MG tablet Take 200 mg by mouth daily.          Review of Systems  Constitutional: Negative for fever and chills.  Eyes: Negative for blurred vision and double vision.  Respiratory: Negative for cough and shortness of breath.   Cardiovascular: Negative for chest pain and  palpitations.  Gastrointestinal: Negative for nausea, vomiting and diarrhea.  Musculoskeletal:       Hand and leg edema   Skin: Negative for rash.  Neurological: Positive for headaches.   Physical Exam   Blood pressure 178/124, pulse 76, temperature 97.4 F (36.3 C), temperature source Oral, resp. rate 18, height 5\' 4"  (1.626 m), weight 203 lb 6 oz (92.25 kg).  Physical Exam  Constitutional: She is oriented to person, place, and time. She appears well-developed and well-nourished. No distress.  HENT:  Head: Normocephalic and atraumatic.  Mouth/Throat: Oropharynx is clear and moist.  Eyes: No scleral icterus.  Neck: Normal range of motion. Neck supple.  Cardiovascular: Normal rate, regular rhythm, normal heart sounds and intact distal pulses.  Exam reveals no gallop.   No murmur heard. Respiratory: Effort normal and breath sounds normal. She has no wheezes. She has no rales.  GI:       Mild epigastric tenderness to palpation.  Gravid with size consistent with dates  Musculoskeletal: She exhibits edema.  Neurological: She is alert and oriented to person, place, and time.  Skin: Skin is warm and dry. No rash noted.  Psychiatric: She has a normal mood and affect. Her behavior is normal.   Dilation: 1 Effacement (%): Thick Cervical Position: Posterior  Exam by:: Dr Elwyn Reach  FHT: 145, minimal to moderate variability, no accels, no decels  MAU Course  Procedures CBC    Component Value Date/Time   WBC 5.3 06/13/2011 0300   RBC 4.37 06/13/2011 0300   HGB 12.1 06/13/2011 0300   HCT 34.2* 06/13/2011 0300   PLT 95* 06/13/2011 0300   MCV 78.3 06/13/2011 0300   MCH 27.7 06/13/2011 0300   MCHC 35.4 06/13/2011 0300   RDW 13.3 06/13/2011 0300   CMP     Component Value Date/Time   NA 136 06/13/2011 0300   K 3.0* 06/13/2011 0300   CL 102 06/13/2011 0300   CO2 23 06/13/2011 0300   GLUCOSE 92 06/13/2011 0300   BUN 13 06/13/2011 0300   CREATININE 0.73 06/13/2011 0300   CREATININE .48 09/02/2010  0137   CALCIUM 7.8* 06/13/2011 0300   PROT 5.0* 06/13/2011 0300   ALBUMIN 2.2* 06/13/2011 0300   AST 27 06/13/2011 0300   ALT 14 06/13/2011 0300   ALKPHOS 97 06/13/2011 0300   BILITOT 1.0 06/13/2011 0300   GFRNONAA >60 06/13/2011 0300   GFRAA >60 06/13/2011 0300   Urine dipstick shows negative for all components, positive for protein (>300). Urine Pr/Cr pending  MDM Will give labetalol IV 40mg  x1 and obtain PIH labs BP came down to 146/96 after second dose of labetalol 40mg  IV.  Assessment and Plan  35 year old G4P2103 at 28.6 with severe range hypertension, >300 protein on UA, and platelets <100, concerning for severe pre-eclampsia. -start Mg -Admit to antenatal  BOOTH, Albert Hersch 06/13/2011, 2:56 AM

## 2011-06-14 DIAGNOSIS — IMO0002 Reserved for concepts with insufficient information to code with codable children: Secondary | ICD-10-CM

## 2011-06-14 LAB — COMPREHENSIVE METABOLIC PANEL
ALT: 12 U/L (ref 0–35)
AST: 19 U/L (ref 0–37)
Calcium: 8.6 mg/dL (ref 8.4–10.5)
Creatinine, Ser: 0.96 mg/dL (ref 0.50–1.10)
GFR calc Af Amer: >60 mL/min (ref >60)
Sodium: 133 mEq/L — ABNORMAL LOW (ref 135–145)
Total Protein: 5.9 g/dL — ABNORMAL LOW (ref 6.0–8.3)

## 2011-06-14 LAB — PROTEIN, URINE, 24 HOUR
Collection Interval-UPROT: 24 hours
Protein, Urine: 782 mg/dL

## 2011-06-14 LAB — CREATININE CLEARANCE, URINE, 24 HOUR
Collection Interval-CRCL: 24 hours
Creatinine Clearance: 89 mL/min (ref 75–115)
Urine Total Volume-CRCL: 625 mL

## 2011-06-14 LAB — CBC
MCH: 27.9 pg (ref 26.0–34.0)
MCHC: 35.2 g/dL (ref 30.0–36.0)
Platelets: 121 10*3/uL — ABNORMAL LOW (ref 150–400)

## 2011-06-14 LAB — RPR: RPR Ser Ql: NONREACTIVE

## 2011-06-14 MED ORDER — IBUPROFEN 600 MG PO TABS
600.0000 mg | ORAL_TABLET | Freq: Four times a day (QID) | ORAL | Status: DC | PRN
  Filled 2011-06-14: qty 1

## 2011-06-14 MED ORDER — TERBUTALINE SULFATE 1 MG/ML IJ SOLN: 0.2500 mg | Freq: Once | INTRAMUSCULAR | Status: AC | PRN

## 2011-06-14 MED ORDER — OXYCODONE-ACETAMINOPHEN 5-325 MG PO TABS: 2.0000 | ORAL_TABLET | ORAL | Status: DC | PRN

## 2011-06-14 MED ORDER — ACETAMINOPHEN 325 MG PO TABS
650.0000 mg | ORAL_TABLET | ORAL | Status: DC | PRN
  Administered 2011-06-14: 650 mg via ORAL
  Filled 2011-06-14: qty 2

## 2011-06-14 MED ORDER — LACTATED RINGERS IV SOLN
INTRAVENOUS | Status: DC
  Administered 2011-06-15: 02:00:00 via INTRAVENOUS

## 2011-06-14 MED ORDER — PENICILLIN G POTASSIUM 5000000 UNITS IJ SOLR
2.5000 10*6.[IU] | INTRAVENOUS | Status: DC
  Administered 2011-06-14 – 2011-06-15 (×2): 2.5 10*6.[IU] via INTRAVENOUS
  Filled 2011-06-14 (×6): qty 2.5

## 2011-06-14 MED ORDER — OXYTOCIN 20 UNITS IN LACTATED RINGERS INFUSION - SIMPLE
125.0000 mL/h | Freq: Once | INTRAVENOUS | Status: DC
  Administered 2011-06-15: 500 mL/h via INTRAVENOUS

## 2011-06-14 MED ORDER — CITRIC ACID-SODIUM CITRATE 334-500 MG/5ML PO SOLN: 30.0000 mL | ORAL | Status: DC | PRN

## 2011-06-14 MED ORDER — LIDOCAINE HCL (PF) 1 % IJ SOLN
30.0000 mL | INTRAMUSCULAR | Status: DC | PRN
  Filled 2011-06-14 (×2): qty 30

## 2011-06-14 MED ORDER — MISOPROSTOL 25 MCG QUARTER TABLET
25.0000 ug | ORAL_TABLET | ORAL | Status: DC | PRN
  Administered 2011-06-14: 25 ug via VAGINAL
  Filled 2011-06-14: qty 0.25
  Filled 2011-06-14: qty 1

## 2011-06-14 MED ORDER — FLEET ENEMA 7-19 GM/118ML RE ENEM: 1.0000 | ENEMA | RECTAL | Status: DC | PRN

## 2011-06-14 MED ORDER — SALINE SPRAY 0.65 % NA SOLN
1.0000 | NASAL | Status: DC | PRN
  Filled 2011-06-14: qty 44

## 2011-06-14 MED ORDER — PENICILLIN G POTASSIUM 5000000 UNITS IJ SOLR
5.0000 10*6.[IU] | Freq: Once | INTRAVENOUS | Status: AC
  Administered 2011-06-14: 5 10*6.[IU] via INTRAVENOUS
  Filled 2011-06-14: qty 5

## 2011-06-14 MED ORDER — MAGNESIUM SULFATE 40 G IN LACTATED RINGERS - SIMPLE
1.0000 g/h | INTRAVENOUS | Status: DC
  Administered 2011-06-15: 1 g/h via INTRAVENOUS
  Administered 2011-06-15: 1 g via INTRAVENOUS
  Filled 2011-06-14 (×2): qty 500

## 2011-06-14 MED ORDER — ZOLPIDEM TARTRATE 10 MG PO TABS: 10.0000 mg | ORAL_TABLET | Freq: Every evening | ORAL | Status: DC | PRN

## 2011-06-14 MED ORDER — OXYTOCIN BOLUS FROM INFUSION
500.0000 mL | Freq: Once | INTRAVENOUS | Status: DC
  Filled 2011-06-14: qty 1000
  Filled 2011-06-14: qty 500

## 2011-06-14 MED ORDER — LACTATED RINGERS IV SOLN: 500.0000 mL | INTRAVENOUS | Status: DC | PRN

## 2011-06-14 MED ORDER — MAGNESIUM SULFATE 40 G IN LACTATED RINGERS - SIMPLE: 1.0000 g/h | INTRAVENOUS | Status: DC

## 2011-06-14 MED ORDER — ONDANSETRON HCL 4 MG/2ML IJ SOLN: 4.0000 mg | Freq: Four times a day (QID) | INTRAMUSCULAR | Status: DC | PRN

## 2011-06-14 NOTE — Consult Note (Signed)
Neonatology Consult to Antenatal Patient:  [name] was admitted yesterday due to severe PIH at 41 6/[redacted]  weeks GA. She is currently not having active labor. She is getting magnesium sulfate and labetalol. I came to speak with her yesterday, but she could not stay awake (due to magnesium), so I came back today.  I spoke with the patient and the father of the baby. We discussed the worst case of delivery in the next 1-2 days, including usual DR management, possible respiratory complications and need for support, IV access, feedings (mother desires bottle feeding), LOS, Mortality and Morbidity, and long term outcomes. The patient was quite sleepy during our talk, but she heard most of our discussion. The father had several questions, which I answered. I offered a NICU tour to him and any interested family members and would be glad to come back if the patient has more questions later.  Thank you for asking me to see this patient.  Deatra James, MD Neonatologist  Time spent: [phone]

## 2011-06-14 NOTE — Progress Notes (Signed)
  Patient reports feeling better this am. She was able to sleep and her frontal headache has resolved completely. She denies cramping/contractions, vaginal bleeding or leakage of fluid She reports good fetal movement. She has not experienced any RUQ/epigastric pain, visual disturbances. She did have emesis twice yesterday  BP 140/83  Pulse 86  Temp(Src) 97.2 F (36.2 C) (Oral)  Resp 18  Ht 5\' 4"  (1.626 m)  Wt 92.25 kg (203 lb 6 oz)  BMI 34.91 kg/m2  SpO2 97% 09/27 0701 - 09/28 0700 In: 2795 [P.O.:870; I.V.:1925] Out: 841 [Urine:841]   Abd: S/Gravid/NT Ext: NT, equal in size, 2+DTR FHT: baseline 115, mod variability, pos accels, no decels Toco: no contractions  CBC    Component Value Date/Time   WBC 9.8 06/14/2011 0450   RBC 4.27 06/14/2011 0450   HGB 11.9* 06/14/2011 0450   HCT 33.8* 06/14/2011 0450   PLT 121* 06/14/2011 0450   MCV 79.2 06/14/2011 0450   MCH 27.9 06/14/2011 0450   MCHC 35.2 06/14/2011 0450   RDW 13.7 06/14/2011 0450    CMP     Component Value Date/Time   NA 135 06/13/2011 1155   K 3.1* 06/13/2011 1155   CL 102 06/13/2011 1155   CO2 23 06/13/2011 1155   GLUCOSE 156* 06/13/2011 1155   BUN 13 06/13/2011 1155   CREATININE 0.70 06/13/2011 1155   CREATININE .48 09/02/2010 0137   CALCIUM 8.3* 06/13/2011 1155   PROT 5.5* 06/13/2011 1155   ALBUMIN 2.3* 06/13/2011 1155   AST 23 06/13/2011 1155   ALT 12 06/13/2011 1155   ALKPHOS 95 06/13/2011 1155   BILITOT 1.0 06/13/2011 1155   GFRNONAA >60 06/13/2011 1155   GFRAA >60 06/13/2011 1155      . betamethasone acetate-betamethasone sodium phosphate  12 mg Intramuscular Q24H  . docusate sodium  100 mg Oral Daily  . hydrALAZINE      . hydrALAZINE  10 mg Intravenous Once  . hydrALAZINE  10 mg Intravenous Once  . labetalol  400 mg Oral TID  . labetalol      . pantoprazole (PROTONIX) IV  40 mg Intravenous QHS    A/P 35 yo G4P2103 at [redacted]w[redacted]d with CHTN admitted to r/o superimposed preeclampsia - Fetal status reassuring - BP  appears to be better controlled on her current regimen of labetalol 400mg  TID - patient completed betamethasone this am - will continue magnesium sulfate for seizure prophylaxis pending results of 24hr urine protein - continue close monitoring of urine output - Will continue to attempt obtaining prenantal records from Dr. Emeline Darling office. Her last 24hr urine protein in December was 144 mg

## 2011-06-14 NOTE — Progress Notes (Signed)
Discussed with the patient the fetal heart tracing: minimal variability with decelerations, as well as how she is feeling. Magnesium still elevated, and urine output has again started to decline. Discussed induction of labor at this time.  Filed Vitals:   06/14/11 1214 06/14/11 1300 06/14/11 1406 06/14/11 1500  BP: 154/86  123/77   Pulse: 84  78   Temp: 98.5 F (36.9 C)     TempSrc:      Resp: 18 16 16 16   Height:      Weight:      SpO2:       General: Somnlent, oriented Heart: RRR, soft 2/6 SEM Lungs: CTA with air movement heard in lower lung fields. DTRs 0-1 (sluggish), no clonus FHT: BR 120, minimal to moderate variability, 10x10 accels (rare), 2 decels noted since 1200, Category 2 Dilation: 1.5 Effacement (%): 50 Cervical Position: Middle Station: Ballotable;-3 Presentation: Vertex Exam by:: Dr Natale Milch   Results for orders placed during the hospital encounter of 06/13/11 (from the past 24 hour(s))  CBC     Status: Abnormal   Collection Time   06/14/11  4:50 AM      Component Value Range   WBC 9.8  4.0 - 10.5 (K/uL)   RBC 4.27  3.87 - 5.11 (MIL/uL)   Hemoglobin 11.9 (*) 12.0 - 15.0 (g/dL)   HCT 16.1 (*) 09.6 - 46.0 (%)   MCV 79.2  78.0 - 100.0 (fL)   MCH 27.9  26.0 - 34.0 (pg)   MCHC 35.2  30.0 - 36.0 (g/dL)   RDW 04.5  40.9 - 81.1 (%)   Platelets 121 (*) 150 - 400 (K/uL)  PROTEIN, URINE, 24 HOUR     Status: Abnormal   Collection Time   06/14/11  5:00 AM      Component Value Range   Urine Total Volume-UPROT 625     Collection Interval-UPROT 24     Protein, Urine 782     Protein, 24H Urine 4888 (*) 50 - 100 (mg/day)  CREATININE CLEARANCE, URINE, 24 HOUR     Status: Normal   Collection Time   06/14/11  5:00 AM      Component Value Range   Urine Total Volume-CRCL 625     Collection Interval-CRCL 24     Creatinine, Urine 143.06     Creatinine 0.70  0.50 - 1.10 (mg/dL)   Creatinine, 91Y Ur 782  700 - 1800 (mg/day)   Creatinine Clearance 89  75 - 115 (mL/min)    COMPREHENSIVE METABOLIC PANEL     Status: Abnormal   Collection Time   06/14/11  9:36 AM      Component Value Range   Sodium 133 (*) 135 - 145 (mEq/L)   Potassium 3.0 (*) 3.5 - 5.1 (mEq/L)   Chloride 98  96 - 112 (mEq/L)   CO2 23  19 - 32 (mEq/L)   Glucose, Bld 127 (*) 70 - 99 (mg/dL)   BUN 14  6 - 23 (mg/dL)   Creatinine, Ser 9.56  0.50 - 1.10 (mg/dL)   Calcium 8.6  8.4 - 21.3 (mg/dL)   Total Protein 5.9 (*) 6.0 - 8.3 (g/dL)   Albumin 2.5 (*) 3.5 - 5.2 (g/dL)   AST 19  0 - 37 (U/L)   ALT 12  0 - 35 (U/L)   Alkaline Phosphatase 98  39 - 117 (U/L)   Total Bilirubin 0.8  0.3 - 1.2 (mg/dL)   GFR calc non Af Amer >60  >60 (mL/min)  GFR calc Af Amer >60  >60 (mL/min)  MAGNESIUM     Status: Abnormal   Collection Time   06/14/11  9:36 AM      Component Value Range   Magnesium 8.1 (*) 1.5 - 2.5 (mg/dL)  MAGNESIUM     Status: Abnormal   Collection Time   06/14/11  1:10 PM      Component Value Range   Magnesium 7.2 (*) 1.5 - 2.5 (mg/dL)     A/P 1. Severe PreEcclampsia: Will induce labor. Because cervix so long and high, will start with cytotec, reeval in 4 hours and determine additional cytotec, foley ballon, or pitocin. Pt at this time does not want epidural. 2. IUP @ 29 wks.: s/p BMZ x2 3. Cont Magnesium, but will hold until 4 PM due to elevated Mg level. 4. Continue close fetal and maternal monitoring, treatment as indicated

## 2011-06-15 ENCOUNTER — Inpatient Hospital Stay (HOSPITAL_COMMUNITY): Payer: BC Managed Care – PPO | Admitting: Anesthesiology

## 2011-06-15 ENCOUNTER — Encounter (HOSPITAL_COMMUNITY)
Admission: AD | Disposition: A | Discharge status: Home / Self Care | Payer: Self-pay | Source: Ambulatory Visit | Attending: Obstetrics and Gynecology

## 2011-06-15 ENCOUNTER — Encounter (HOSPITAL_COMMUNITY): Payer: Self-pay | Admitting: *Deleted

## 2011-06-15 ENCOUNTER — Encounter (HOSPITAL_COMMUNITY): Payer: Self-pay | Admitting: Anesthesiology

## 2011-06-15 DIAGNOSIS — Z302 Encounter for sterilization: Secondary | ICD-10-CM

## 2011-06-15 DIAGNOSIS — O1410 Severe pre-eclampsia, unspecified trimester: Secondary | ICD-10-CM

## 2011-06-15 DIAGNOSIS — E669 Obesity, unspecified: Secondary | ICD-10-CM

## 2011-06-15 HISTORY — PX: TUBAL LIGATION: SHX77

## 2011-06-15 LAB — CBC
Hemoglobin: 11.2 g/dL — ABNORMAL LOW (ref 12.0–15.0)
MCH: 27.3 pg (ref 26.0–34.0)
MCHC: 34.5 g/dL (ref 30.0–36.0)
MCV: 79.1 fL (ref 78.0–100.0)
Platelets: 136 10*3/uL — ABNORMAL LOW (ref 150–400)
RBC: 4.11 MIL/uL (ref 3.87–5.11)

## 2011-06-15 SURGERY — LIGATION, FALLOPIAN TUBE, POSTPARTUM
Anesthesia: General | Site: Abdomen | Laterality: Bilateral | Wound class: Clean

## 2011-06-15 MED ORDER — SENNOSIDES-DOCUSATE SODIUM 8.6-50 MG PO TABS
2.0000 | ORAL_TABLET | Freq: Every day | ORAL | Status: DC
  Administered 2011-06-15: 2 via ORAL

## 2011-06-15 MED ORDER — WITCH HAZEL-GLYCERIN EX PADS: 1.0000 "application " | MEDICATED_PAD | CUTANEOUS | Status: DC | PRN

## 2011-06-15 MED ORDER — LACTATED RINGERS IV SOLN: INTRAVENOUS | Status: DC

## 2011-06-15 MED ORDER — EPHEDRINE SULFATE 50 MG/ML IJ SOLN
INTRAMUSCULAR | Status: DC | PRN
  Administered 2011-06-15 (×2): 10 mg via INTRAVENOUS
  Administered 2011-06-15: 5 mg via INTRAVENOUS
  Administered 2011-06-15 (×6): 10 mg via INTRAVENOUS

## 2011-06-15 MED ORDER — OXYTOCIN 20 UNITS IN LACTATED RINGERS INFUSION - SIMPLE
1.0000 m[IU]/min | INTRAVENOUS | Status: DC
  Filled 2011-06-15: qty 1000

## 2011-06-15 MED ORDER — FAMOTIDINE 20 MG PO TABS
40.0000 mg | ORAL_TABLET | Freq: Once | ORAL | Status: AC
  Administered 2011-06-15: 40 mg via ORAL
  Filled 2011-06-15: qty 2

## 2011-06-15 MED ORDER — LACTATED RINGERS IV SOLN
INTRAVENOUS | Status: DC
  Administered 2011-06-15 (×2): via INTRAVENOUS

## 2011-06-15 MED ORDER — BUPIVACAINE HCL (PF) 0.25 % IJ SOLN
INTRAMUSCULAR | Status: DC | PRN
  Administered 2011-06-15: 8 mL

## 2011-06-15 MED ORDER — DIPHENHYDRAMINE HCL 25 MG PO CAPS: 25.0000 mg | ORAL_CAPSULE | Freq: Four times a day (QID) | ORAL | Status: DC | PRN

## 2011-06-15 MED ORDER — FENTANYL CITRATE 0.05 MG/ML IJ SOLN: 1.0000 ug/kg | INTRAMUSCULAR | Status: DC | PRN

## 2011-06-15 MED ORDER — TETANUS-DIPHTH-ACELL PERTUSSIS 5-2.5-18.5 LF-MCG/0.5 IM SUSP
0.5000 mL | Freq: Once | INTRAMUSCULAR | Status: DC
  Filled 2011-06-15: qty 0.5

## 2011-06-15 MED ORDER — OXYCODONE-ACETAMINOPHEN 5-325 MG PO TABS
1.0000 | ORAL_TABLET | ORAL | Status: DC | PRN
  Administered 2011-06-16: 1 via ORAL
  Filled 2011-06-15: qty 1

## 2011-06-15 MED ORDER — LIDOCAINE HCL 1 % IJ SOLN
INTRAMUSCULAR | Status: AC
  Filled 2011-06-15: qty 20

## 2011-06-15 MED ORDER — TERBUTALINE SULFATE 1 MG/ML IJ SOLN: 0.2500 mg | Freq: Once | INTRAMUSCULAR | Status: DC | PRN

## 2011-06-15 MED ORDER — ONDANSETRON HCL 4 MG/2ML IJ SOLN
INTRAMUSCULAR | Status: AC
  Filled 2011-06-15: qty 2

## 2011-06-15 MED ORDER — ONDANSETRON HCL 4 MG/2ML IJ SOLN: 4.0000 mg | INTRAMUSCULAR | Status: DC | PRN

## 2011-06-15 MED ORDER — PHENYLEPHRINE HCL 10 MG/ML IJ SOLN
INTRAMUSCULAR | Status: DC | PRN
  Administered 2011-06-15 (×2): 40 ug via INTRAVENOUS

## 2011-06-15 MED ORDER — OXYTOCIN 20 UNITS IN LACTATED RINGERS INFUSION - SIMPLE
1.0000 m[IU]/min | INTRAVENOUS | Status: DC
  Administered 2011-06-15: 2 m[IU]/min via INTRAVENOUS
  Filled 2011-06-15: qty 1000

## 2011-06-15 MED ORDER — ONDANSETRON HCL 4 MG PO TABS: 4.0000 mg | ORAL_TABLET | ORAL | Status: DC | PRN

## 2011-06-15 MED ORDER — DIBUCAINE 1 % RE OINT: 1.0000 "application " | TOPICAL_OINTMENT | RECTAL | Status: DC | PRN

## 2011-06-15 MED ORDER — ZOLPIDEM TARTRATE 5 MG PO TABS: 5.0000 mg | ORAL_TABLET | Freq: Every evening | ORAL | Status: DC | PRN

## 2011-06-15 MED ORDER — HYDRALAZINE HCL 20 MG/ML IJ SOLN
10.0000 mg | Freq: Once | INTRAMUSCULAR | Status: AC
  Administered 2011-06-15: 20 mg via INTRAVENOUS
  Filled 2011-06-15: qty 1

## 2011-06-15 MED ORDER — PRENATAL PLUS 27-1 MG PO TABS
1.0000 | ORAL_TABLET | Freq: Every day | ORAL | Status: DC
  Administered 2011-06-16: 1 via ORAL
  Filled 2011-06-15: qty 1

## 2011-06-15 MED ORDER — METOCLOPRAMIDE HCL 10 MG PO TABS
10.0000 mg | ORAL_TABLET | Freq: Once | ORAL | Status: AC
  Administered 2011-06-15: 10 mg via ORAL
  Filled 2011-06-15: qty 1

## 2011-06-15 MED ORDER — LANOLIN HYDROUS EX OINT: TOPICAL_OINTMENT | CUTANEOUS | Status: DC | PRN

## 2011-06-15 MED ORDER — SIMETHICONE 80 MG PO CHEW: 80.0000 mg | CHEWABLE_TABLET | ORAL | Status: DC | PRN

## 2011-06-15 MED ORDER — ONDANSETRON HCL 4 MG/2ML IJ SOLN
INTRAMUSCULAR | Status: DC | PRN
  Administered 2011-06-15: 4 mg via INTRAVENOUS

## 2011-06-15 MED ORDER — BENZOCAINE-MENTHOL 20-0.5 % EX AERO: 1.0000 "application " | INHALATION_SPRAY | CUTANEOUS | Status: DC | PRN

## 2011-06-15 MED ORDER — IBUPROFEN 600 MG PO TABS
600.0000 mg | ORAL_TABLET | Freq: Four times a day (QID) | ORAL | Status: DC
  Administered 2011-06-15 – 2011-06-16 (×4): 600 mg via ORAL
  Filled 2011-06-15 (×4): qty 1

## 2011-06-15 MED ORDER — EPHEDRINE 5 MG/ML INJ
INTRAVENOUS | Status: AC
  Filled 2011-06-15: qty 10

## 2011-06-15 MED ORDER — SODIUM CHLORIDE 0.9 % IR SOLN
Status: DC | PRN
  Administered 2011-06-15: 1000 mL

## 2011-06-15 SURGICAL SUPPLY — 25 items
BLADE SURG 11 STRL SS (BLADE) ×2 IMPLANT
CATH ROBINSON RED A/P 16FR (CATHETERS) IMPLANT
CHLORAPREP W/TINT 26ML (MISCELLANEOUS) ×2 IMPLANT
CLIP FILSHIE TUBAL LIGA STRL (Clip) ×2 IMPLANT
CLOTH BEACON ORANGE TIMEOUT ST (SAFETY) ×2 IMPLANT
DRAPE UTILITY XL STRL (DRAPES) IMPLANT
DRSG COVADERM PLUS 2X2 (GAUZE/BANDAGES/DRESSINGS) ×2 IMPLANT
GLOVE BIO SURGEON STRL SZ 6.5 (GLOVE) ×4 IMPLANT
GLOVE BIO SURGEON STRL SZ7 (GLOVE) ×2 IMPLANT
GLOVE BIOGEL PI IND STRL 7.0 (GLOVE) ×2 IMPLANT
GLOVE BIOGEL PI INDICATOR 7.0 (GLOVE) ×2
GLOVE SKINSENSE NS SZ6.5 (GLOVE) ×2
GLOVE SKINSENSE STRL SZ6.5 (GLOVE) ×2 IMPLANT
GOWN PREVENTION PLUS LG XLONG (DISPOSABLE) ×6 IMPLANT
NEEDLE HYPO 25X1 1.5 SAFETY (NEEDLE) ×2 IMPLANT
NS IRRIG 1000ML POUR BTL (IV SOLUTION) ×2 IMPLANT
PACK ABDOMINAL MINOR (CUSTOM PROCEDURE TRAY) ×2 IMPLANT
SPONGE LAP 4X18 X RAY DECT (DISPOSABLE) ×2 IMPLANT
SUT VIC AB 0 CT1 27 (SUTURE) ×1
SUT VIC AB 0 CT1 27XBRD ANBCTR (SUTURE) ×1 IMPLANT
SUT VIC AB 4-0 PS2 18 (SUTURE) ×2 IMPLANT
SYR CONTROL 10ML LL (SYRINGE) IMPLANT
TOWEL OR 17X24 6PK STRL BLUE (TOWEL DISPOSABLE) ×2 IMPLANT
TRAY FOLEY BAG SILVER LF 14FR (CATHETERS) IMPLANT
WATER STERILE IRR 1000ML POUR (IV SOLUTION) IMPLANT

## 2011-06-15 NOTE — Anesthesia Postprocedure Evaluation (Signed)
  Anesthesia Post-op Note  Patient: Erika Eaton   Patient is awake, responsive, moving her legs, and has signs of resolution of her numbness. Pain and nausea are reasonably well controlled. Vital signs are stable and clinically acceptable. Oxygen saturation is clinically acceptable. There are no apparent anesthetic complications at this time. Patient is ready for discharge.

## 2011-06-15 NOTE — Progress Notes (Signed)
Post Partum Day 0 Subjective: no complaints and wants sterilization   Objective: Blood pressure 151/93, pulse 77, temperature 97.5 F (36.4 C), temperature source Oral, resp. rate 18, height 5\' 6"  (1.676 m), weight 95.664 kg (210 lb 14.4 oz), SpO2 93.00%, unknown if currently breastfeeding.  Physical Exam:  General: fatigued and no distress Lochia: appropriate Uterine Fundus: firm Incision: n/a DVT Evaluation: No evidence of DVT seen on physical exam.   Basename 06/14/11 0450 06/13/11 1155  HGB 11.9* 12.1  HCT 33.8* 34.4*    Assessment/Plan: Contraception plans BTL.She had a failed procedure Jan. 2012, Pomeroy method. Offered Filshie clips, procedure and the risk of failure, ectopic, bleeding, infection, bowel and urinary tract damage and anesthesia discussed, questions answered.   LOS: 2 days   Favor Hackler 06/15/2011, 9:38 AM

## 2011-06-15 NOTE — Anesthesia Procedure Notes (Signed)
Spinal Block  Patient location during procedure: OR Staffing Anesthesiologist: Jiles Garter Preanesthetic Checklist Completed: patient identified, site marked, surgical consent, pre-op evaluation, timeout performed, IV checked, risks and benefits discussed and monitors and equipment checked Spinal Block Patient position: sitting Prep: DuraPrep Patient monitoring: heart rate, cardiac monitor, continuous pulse ox and blood pressure Approach: midline Location: L3-4 Injection technique: single-shot Needle Needle type: Sprotte  Needle gauge: 24 G Needle length: 9 cm Assessment Sensory level: T4 Additional Notes Spinal Dosage in OR  Bupivicaine ml       1.6

## 2011-06-15 NOTE — Anesthesia Preprocedure Evaluation (Addendum)
Anesthesia Evaluation  Name, MR# and DOB Patient awake  General Assessment Comment  Reviewed: Allergy & Precautions, H&P , Patient's Chart, lab work & pertinent test results  Airway Mallampati: III TM Distance: >3 FB Neck ROM: full    Dental No notable dental hx.    Pulmonary  clear to auscultation  pulmonary exam normalPulmonary Exam Normal breath sounds clear to auscultation, no rales (chest clear, no dyspnea) none    Cardiovascular Exercise Tolerance: Good Hypertension: PIH, on Hydralazine and Labatolol, also Mg +2. On Medications regular Normal    Neuro/Psych   GI/Hepatic/Renal   Endo/Other    Abdominal   Musculoskeletal   Hematology   Peds  Reproductive/Obstetrics    Anesthesia Other Findings             Anesthesia Physical Anesthesia Plan  ASA: III  Anesthesia Plan: Spinal   Post-op Pain Management:    Induction:   Airway Management Planned:   Additional Equipment:   Intra-op Plan:   Post-operative Plan:   Informed Consent: I have reviewed the patients History and Physical, chart, labs and discussed the procedure including the risks, benefits and alternatives for the proposed anesthesia with the patient or authorized representative who has indicated his/her understanding and acceptance.   Dental Advisory Given  Plan Discussed with: CRNA  Anesthesia Plan Comments: (Lab work confirmed with CRNA in room. Platelets okay. Discussed spinal anesthetic, and patient consents to the procedure:  included risk of possible headache,backache, failed block, allergic reaction, and nerve injury. This patient was asked if she had any questions or concerns before the procedure started. )        Anesthesia Quick Evaluation

## 2011-06-15 NOTE — Transfer of Care (Signed)
Immediate Anesthesia Transfer of Care Note  Patient: Erika Eaton  Procedure(s) Performed:  POST PARTUM TUBAL LIGATION - bilateral post partum tubal ligation with filshie clips.  Patient Location: PACU  Anesthesia Type: Spinal  Level of Consciousness: awake, alert  and oriented  Airway & Oxygen Therapy: Patient Spontanous Breathing  Post-op Assessment: Report given to PACU RN and Post -op Vital signs reviewed and stable  Post vital signs: Reviewed and stable  Complications: No apparent anesthesia complications

## 2011-06-15 NOTE — Progress Notes (Signed)
Pt transferred to AICU per w/c in stable condition

## 2011-06-15 NOTE — Progress Notes (Signed)
Erika Eaton is a 35 y.o. [id number] at [redacted]w[redacted]d by ultrasound admitted for induction of labor due to Pre-eclamptic toxemia of pregnancy..  Subjective:   Objective: BP 140/89  Pulse 87  Temp(Src) 98.4 F (36.9 C) (Oral)  Resp 18  Ht 5\' 4"  (1.626 m)  Wt 203 lb 6 oz (92.25 kg)  BMI 34.91 kg/m2  SpO2 97% I/O last 3 completed shifts: In: 4152.7 [P.O.:1265; I.V.:2637.7; IV Piggyback:250] Out: 1341 [Urine:1341] Total I/O In: 465.8 [P.O.:60; I.V.:305.8; IV Piggyback:100] Out: 150 [Urine:150]  98.4 F (36.9 C) 87 -- 18 140/89 mmHg -- -- -- AW 06/15/11 0001 -- 86 -- 18 ! 136/51 mmHg -- -- -- AW 06/14/11 2301 -- 87 -- 16 134/79 mmHg -- -- -- AW 06/14/11 2209 -- 88 -- 16 ! 162/100 mmHg -- -- -- AW 06/14/11 2201 98 F (36.7 C) 88 -- 16 ! 162/100 mmHg -- -- -- AW 06/14/11 2101 -- 92 -- 16 ! 157/89 mmHg   FHT:  FHR: 145 bpm, variability: minimal ,  accelerations:  Present,  decelerations:  Absent UC:   irregular, every 4-8 minutes (not tracing well) SVE:   Dilation: 5.5 Effacement (%): 50 Station: -2 Exam by:: Dr. Natale Eaton  Labs: Lab Results  Component Value Date   WBC 9.8 06/14/2011   HGB 11.9* 06/14/2011   HCT 33.8* 06/14/2011   MCV 79.2 06/14/2011   PLT 121* 06/14/2011    Assessment / Plan: Foley balloon removed, and cervix 5.5 cm. Will start pitocin. Bulging bag of water palpated. Preeclampsia:  on magnesium sulfate and no signs or symptoms of toxicity Fetal Wellbeing:  Category II Pain Control:  Labor support without medications I/D:  on GBS prophylaxis wtih PCN Anticipated MOD:  NSVD  Erika Eaton N 06/15/2011, 1:04 AM

## 2011-06-15 NOTE — Progress Notes (Signed)
Erika Eaton is a 35 y.o. [id number] at [redacted]w[redacted]d by [place] for IOL for severe PreE Subjective:   Objective: BP 140/89  Pulse 87  Temp(Src) 98.4 F (36.9 C) (Oral)  Resp 18  Ht 5\' 4"  (1.626 m)  Wt 203 lb 6 oz (92.25 kg)  BMI 34.91 kg/m2  SpO2 97% I/O last 3 completed shifts: In: 4152.7 [P.O.:1265; I.V.:2637.7; IV Piggyback:250] Out: 1341 [Urine:1341] Total I/O In: 465.8 [P.O.:60; I.V.:305.8; IV Piggyback:100] Out: 150 [Urine:150]  FHT:  FHR: 140 bpm, variability: minimal ,  accelerations:  Present,  decelerations:  Absent UC:   none SVE:  2.5/50%/-2 to -3 Labs: Lab Results  Component Value Date   WBC 9.8 06/14/2011   HGB 11.9* 06/14/2011   HCT 33.8* 06/14/2011   MCV 79.2 06/14/2011   PLT 121* 06/14/2011    Assessment / Plan: IOL: Foley balloon placed and inflated with 60 mL sterile water. Will start pitocin after foley balloon out Preeclampsia:  on magnesium sulfate and no signs or symptoms of toxicity Fetal Wellbeing:  Category II Pain Control:  Labor support without medications I/D:  on PCN for GBS (unk) prophylaxis Anticipated MOD:  NSVD  Treshon Stannard N 06/15/2011, 1:07 AM

## 2011-06-15 NOTE — OR Nursing (Signed)
Foley catheter in place upon arrival to OR. Urine color-blood tinged. SCD sleeves on upon arrival to OR.

## 2011-06-15 NOTE — Progress Notes (Signed)
CSW attempted to meet with MOB due to baby being admitted to NICU. MOB was not in room. CSW will follow up at a later time.

## 2011-06-15 NOTE — Progress Notes (Signed)
  Erika Eaton is a 35 y.o. [id number] at [redacted]w[redacted]d admitted for induction of labor due to Pre-eclamptic toxemia of pregnancy..  Subjective: Pt resting.  Per family report, has started having contractions that wake her up every 4 minutes for the last 10-15 minutes.  Objective: BP 128/82  Pulse 92  Temp(Src) 98.4 F (36.9 C) (Oral)  Resp 18  Ht 5\' 4"  (1.626 m)  Wt 203 lb 6 oz (92.25 kg)  BMI 34.91 kg/m2  SpO2 97% I/O last 3 completed shifts: In: 4152.7 [P.O.:1265; I.V.:2637.7; IV Piggyback:250] Out: 1341 [Urine:1341] Total I/O In: 972.7 [P.O.:240; I.V.:632.7; IV Piggyback:100] Out: 300 [Urine:300]  FHT:  FHR: 135 bpm, variability: minimal ,  accelerations:  Abscent,  decelerations:  Absent UC:   None tracing SVE:   Exam deferred Labs: Lab Results  Component Value Date   WBC 9.8 06/14/2011   HGB 11.9* 06/14/2011   HCT 33.8* 06/14/2011   MCV 79.2 06/14/2011   PLT 121* 06/14/2011    Assessment / Plan: Induction of labor due to preeclampsia,  progressing well on pitocin  Labor: Progressing on Pitocin, will continue to increase then AROM Fetal Wellbeing:  Category I Pain Control:  Labor support without medications and planning on epidural I/D:  GBS unknown on PCN  Anticipated MOD:  NSVD  BOOTH, Chioma Mukherjee 06/15/2011, 4:05 AM

## 2011-06-15 NOTE — Op Note (Signed)
Erika Eaton 06/15/2011  PREOPERATIVE DIAGNOSIS:  Undesired fertility  POSTOPERATIVE DIAGNOSIS:  Undesired fertility  PROCEDURE:  Postpartum Bilateral Tubal Sterilization using Filshie Clips   SURGEON:  ASSISTANT:  ANESTHESIA:  Epidural  COMPLICATIONS:  None immediate.  ESTIMATED BLOOD LOSS:  Less than 20cc.  INDICATIONS: 35 y.o. yo [id number]  with undesired fertility,status post vaginal delivery, desires permanent sterilization. Risks and benefits of procedure discussed with patient including permanence of method, bleeding, infection, injury to surrounding organs and need for additional procedures. Risk failure of 0.5-1% with increased risk of ectopic gestation if pregnancy occurs was also discussed with patient.   FINDINGS:  Normal uterus, tubes, and ovaries.  TECHNIQUE:  The patient was taken to the operating room where her spinal anesthesia was placed and found to be adequate.  She was then placed in the dorsal supine position and prepped and draped in sterile fashion.  After an adequate timeout was performed, attention was turned to the patient's abdomen where 8mL of marcaine 0.25% was infiltrated and a small transverse skin incision was made under the umbilical fold. The incision was taken down to the layer of fascia using the scalpel, and fascia was incised, and extended bilaterally using Mayo scissors. The peritoneum was entered in a sharp fashion. Attention was then turned to the patient's uterus, and right fallopian tube was identified and followed out to the fimbriated end.  A suture was noted in the interrupted fallopian tube.  A Filshie clip was placed on the right fallopian tube about 1 cm from the proximal interruption, with care given to incorporate the underlying mesosalpinx.  A similar process was carried out on the left side allowing for bilateral tubal sterilization.  Hemostasis was noted overall.  The instruments were then removed from the patient's abdomen and the  fascial incision was repaired with 0 Vicryl, and the skin was closed with a 3-0 Vicryl subcuticular stitch. The patient tolerated the procedure well.  Sponge, lap, and needle counts were correct times two.  The patient was then taken to the recovery room awake, extubated and in stable condition.

## 2011-06-16 LAB — CULTURE, BETA STREP (GROUP B ONLY)

## 2011-06-16 MED ORDER — OXYCODONE-ACETAMINOPHEN 5-325 MG PO TABS
1.0000 | ORAL_TABLET | ORAL | Status: DC | PRN
  Administered 2011-06-16: 1 via ORAL
  Filled 2011-06-16: qty 1

## 2011-06-16 MED ORDER — WITCH HAZEL-GLYCERIN EX PADS: 1.0000 "application " | MEDICATED_PAD | CUTANEOUS | Status: DC | PRN

## 2011-06-16 MED ORDER — SIMETHICONE 80 MG PO CHEW: 80.0000 mg | CHEWABLE_TABLET | ORAL | Status: DC | PRN

## 2011-06-16 MED ORDER — PRENATAL PLUS 27-1 MG PO TABS
1.0000 | ORAL_TABLET | Freq: Every day | ORAL | Status: DC
  Administered 2011-06-17: 1 via ORAL
  Filled 2011-06-16 (×2): qty 1

## 2011-06-16 MED ORDER — LABETALOL HCL 200 MG PO TABS
400.0000 mg | ORAL_TABLET | Freq: Two times a day (BID) | ORAL | Status: DC
  Filled 2011-06-16 (×3): qty 2

## 2011-06-16 MED ORDER — ONDANSETRON HCL 4 MG/2ML IJ SOLN: 4.0000 mg | INTRAMUSCULAR | Status: DC | PRN

## 2011-06-16 MED ORDER — AMLODIPINE BESYLATE 10 MG PO TABS
10.0000 mg | ORAL_TABLET | Freq: Every day | ORAL | Status: DC
  Administered 2011-06-16 – 2011-06-17 (×2): 10 mg via ORAL
  Filled 2011-06-16 (×3): qty 1

## 2011-06-16 MED ORDER — SODIUM CHLORIDE 0.9 % IJ SOLN
3.0000 mL | Freq: Two times a day (BID) | INTRAMUSCULAR | Status: DC
  Administered 2011-06-16: 3 mL via INTRAVENOUS

## 2011-06-16 MED ORDER — LABETALOL HCL 200 MG PO TABS
400.0000 mg | ORAL_TABLET | Freq: Three times a day (TID) | ORAL | Status: DC
  Administered 2011-06-16 – 2011-06-17 (×3): 400 mg via ORAL
  Filled 2011-06-16 (×5): qty 2

## 2011-06-16 MED ORDER — IBUPROFEN 600 MG PO TABS
600.0000 mg | ORAL_TABLET | Freq: Four times a day (QID) | ORAL | Status: DC
  Administered 2011-06-17 (×2): 600 mg via ORAL
  Filled 2011-06-16 (×3): qty 1

## 2011-06-16 MED ORDER — ZOLPIDEM TARTRATE 5 MG PO TABS: 5.0000 mg | ORAL_TABLET | Freq: Every evening | ORAL | Status: DC | PRN

## 2011-06-16 MED ORDER — LANOLIN HYDROUS EX OINT: TOPICAL_OINTMENT | CUTANEOUS | Status: DC | PRN

## 2011-06-16 MED ORDER — HYDRALAZINE HCL 20 MG/ML IJ SOLN
10.0000 mg | Freq: Once | INTRAMUSCULAR | Status: AC
  Administered 2011-06-16: 10 mg via INTRAVENOUS
  Filled 2011-06-16: qty 1

## 2011-06-16 MED ORDER — BENZOCAINE-MENTHOL 20-0.5 % EX AERO: 1.0000 "application " | INHALATION_SPRAY | CUTANEOUS | Status: DC | PRN

## 2011-06-16 MED ORDER — DIPHENHYDRAMINE HCL 25 MG PO CAPS: 25.0000 mg | ORAL_CAPSULE | Freq: Four times a day (QID) | ORAL | Status: DC | PRN

## 2011-06-16 MED ORDER — SODIUM CHLORIDE 0.9 % IJ SOLN
10.0000 mL | Freq: Three times a day (TID) | INTRAMUSCULAR | Status: DC
  Filled 2011-06-16: qty 10

## 2011-06-16 MED ORDER — TETANUS-DIPHTH-ACELL PERTUSSIS 5-2.5-18.5 LF-MCG/0.5 IM SUSP
0.5000 mL | Freq: Once | INTRAMUSCULAR | Status: DC
  Filled 2011-06-16: qty 0.5

## 2011-06-16 MED ORDER — LACTATED RINGERS IV SOLN
INTRAVENOUS | Status: DC
  Administered 2011-06-16: 07:00:00 via INTRAVENOUS

## 2011-06-16 MED ORDER — DIBUCAINE 1 % RE OINT: 1.0000 "application " | TOPICAL_OINTMENT | RECTAL | Status: DC | PRN

## 2011-06-16 MED ORDER — ONDANSETRON HCL 4 MG PO TABS: 4.0000 mg | ORAL_TABLET | ORAL | Status: DC | PRN

## 2011-06-16 MED ORDER — PANTOPRAZOLE SODIUM 40 MG PO TBEC
40.0000 mg | DELAYED_RELEASE_TABLET | Freq: Every day | ORAL | Status: DC
  Administered 2011-06-16: 40 mg via ORAL
  Filled 2011-06-16 (×2): qty 1

## 2011-06-16 MED ORDER — SENNOSIDES-DOCUSATE SODIUM 8.6-50 MG PO TABS: 2.0000 | ORAL_TABLET | Freq: Every day | ORAL | Status: DC

## 2011-06-16 MED ORDER — SALINE SPRAY 0.65 % NA SOLN
1.0000 | NASAL | Status: DC | PRN
  Administered 2011-06-16: 1 via NASAL
  Filled 2011-06-16: qty 44

## 2011-06-16 NOTE — Progress Notes (Signed)
Encounter addended by: Len Blalock on: 06/16/2011  6:27 PM<BR>     Documentation filed: Notes Section

## 2011-06-16 NOTE — Progress Notes (Signed)
Post Partum Day 1; S/P  NSVD and BTL; Preterm Delivery  Subjective: up ad lib, voiding, tolerating PO, + flatus and reports incisional pain relieved with meds.  Denies PIH symptoms at this time.    Objective: Blood pressure 149/109, pulse 73, temperature 97.5 F (36.4 C), temperature source Oral, resp. rate 20, height 5\' 6"  (1.676 m), weight 98.657 kg (217 lb 8 oz), SpO2 96.00%, unknown if currently breastfeeding.  Physical Exam:  General: alert and cooperative CVS:  RRR, without murmur, gallop, or rubs Lungs:  CTA Bilat Lochia: appropriate Uterine Fundus: firm; 1 below umbilicus Incision: Dressing, clean, dry, and intact; no redness DVT Evaluation: No evidence of DVT seen on physical exam.   Basename 06/15/11 1000 06/14/11 0450  HGB 11.2* 11.9*  HCT 32.5* 33.8*    Assessment/Plan: Preeclampsia Preterm Delivery BTL  DC Magnesium Sulfate Reassess in 4 hours; Call provider for report and possible transfer orders.   LOS: 3 days   [facility] 06/16/2011, 7:54 AM

## 2011-06-16 NOTE — Progress Notes (Signed)
PSYCHOSOCIAL ASSESSMENT ~ MATERNAL/CHILD Name: Lynnae January McSwain__________________________         Age__28weeks____  __________________________________________________________________________________________  Referral Date ___9____/_____29___/____12____  Reason/Source_NICU admit_________________        I. FAMILY/HOME ENVIRONMENT A. Child's Legal Guardian q Parent(s) q Lamonte Richer parent q DSS _______________________ Name____Demetria__McSwain__#[phone] home [phone] work___________________________ DOB__6___/____20__/___1977___   Age____35_______ Address __[address], [place] ______________________________________________________________________ Name: Sheria Lang Weaver_______#[phone]________________________ DOB_____/______/______   Age___________Address ______[address] [place], [place] _______________________________________________________________________ B. Other Household Members/Support Persons Name___Sherri Ray__#[phone]____             Relationship____Mat. Gma_________   DOB ____/____/____        Name__Madison McSwain (same FOB)_             Relationship_sister________  DOB _1___/____/_2012___        Name___Dwaune_McSwain_(dif. FOB)________ Relationship__13yo___brother_____DOB____/____/____                   Name_Tiara Jennings___(dif. FOB)________       Relationship__18yo sister______   DOB ____/____/____ C.   Other Support _____________extended family and friends____________________________ II. PSYCHOSOCIAL DATA A. Information Source q Patient Interview  x Family Interview           xOther _discussion with RN_________________ B. Paediatric nurse  Both parents work at [employer] and [employer]. 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  X Medicaid     County__[place]_______________     q Private Insurance_______________         Self Pay  Food Stamps     XWIC  Work [employer]      Section 8    q Maternity Care Coordination/Child  Service Coordination/Early Intervention _______________________   School _____________________________________________________ Grade______________ Other__MGM helps with childcare _____________________________________________________________________________ C. Cultural and Environment Information Cultural Issues Impacting Care__________none noted_____________________________________________________ III. STRENGTHS x Supportive family/friends  X Adequate Resources x Compliance with medical plan  Home prepared for Child (including basic supplies)                x Understanding of illness           x Other__has family to assist with transportation and childcare. Ped will be              [facility]_____________________________________________________________________________ IV. RISK FACTORS AND CURRENT PROBLEMS        No Problems Noted            Pt Family      Pt Family  Substance Abuse_________________              Mental Illness_______________  Family/Relationship Issues     X         Abuse/Neglect/Domestic Violence     Financial Resources        X             Transportation                                                                DSS Involvement  Adjustment to Illness                                      X         Knowledge/Cognitive Deficit                                          Compliance with Treatment     Basic Needs (food, housing, etc.)                          Housing Concerns   Other ___________________________________________________________________________________            V. SOCIAL WORK ASSESSMENT VI. _CSW met with both parents to assess needs due to Caleb's admission to NICU. Parents were having a disagreement about what to name Wilber Oliphant when social worker entered the room. FOB was upset and left the room and CSW completed assessment with MOB. She reported that FOB and her could not agree as he wanted Caleb to have his last name and she did  not. Wilber Oliphant is parent's second child together and was unexpected. MOB had her tubes tied back in January and this was not successful. She is happy that Wilber Oliphant is here, but states it will be an adjustment with 65 month old [place] and her older children at home. She states FOB will help her, but this is his fourth child and times are tight. Parents work together, but the census has been done so hours are hard to come by. MOB appears to be well-linked with community resources and reports family are very helpful and supportive.  CSW discussed resources and ways to cope with NICU baby. MOB stated they were both stressed about Wilber Oliphant now being on the vent. CSW provided support and explained the weekday CSW is available to assist as well.  MOB open to assistance and stated she would seek help as needed.     VII. SOCIAL WORK PLAN q No Further Intervention Required/No Barriers to Discharge X Psychosocial Support and Ongoing Assessment of Needs X Patient/Family Education_____adjustment to NICU admit ______________________________________________________________ q Child Protective Services Report County______________________        Date_____/______/______ q Information/Referral to Community Resources__________________________________________________ q Other___________________________________________________________________________________  Doreen Salvage, LCSWA                                                                           06/16/11 13:45 Clinical Social Worker Teacher, music

## 2011-06-16 NOTE — Progress Notes (Signed)
0800 magnesium d/c'd, IV saline locked.

## 2011-06-16 NOTE — Anesthesia Postprocedure Evaluation (Signed)
  Anesthesia Post-op Note  Patient: Erika Eaton  Procedure(s) Performed:  POST PARTUM TUBAL LIGATION - bilateral post partum tubal ligation with filshie clips.  Patient Location: PACU and Women's Unit  Anesthesia Type: Spinal  Level of Consciousness: awake, alert  and oriented  Airway and Oxygen Therapy: Patient Spontanous Breathing   Post-op Assessment: Patient's Cardiovascular Status Stable and Respiratory Function Stable  Post-op Vital Signs: stable  Complications: No apparent anesthesia complications

## 2011-06-17 ENCOUNTER — Encounter (HOSPITAL_COMMUNITY): Payer: Self-pay | Admitting: Obstetrics & Gynecology

## 2011-06-17 MED ORDER — AMLODIPINE BESYLATE 10 MG PO TABS: 10.0000 mg | ORAL_TABLET | Freq: Every day | ORAL | Status: DC

## 2011-06-17 MED ORDER — OXYCODONE-ACETAMINOPHEN 5-325 MG PO TABS: 1.0000 | ORAL_TABLET | ORAL | Status: AC | PRN

## 2011-06-17 MED ORDER — LABETALOL HCL 200 MG PO TABS: 400.0000 mg | ORAL_TABLET | Freq: Three times a day (TID) | ORAL | Status: DC

## 2011-06-17 MED ORDER — IBUPROFEN 600 MG PO TABS: 600.0000 mg | ORAL_TABLET | Freq: Four times a day (QID) | ORAL | Status: AC

## 2011-06-17 NOTE — Progress Notes (Signed)
UR chart review completed.  

## 2011-06-17 NOTE — Progress Notes (Signed)
Post Partum Day 2 Subjective: no complaints, up ad lib, voiding and tolerating PO  Objective: Blood pressure 127/82, pulse 82, temperature 98 F (36.7 C), temperature source Oral, resp. rate 18, height 5\' 6"  (1.676 m), weight 98.657 kg (217 lb 8 oz), SpO2 98.00%, unknown if currently breastfeeding.  Physical Exam:  General: alert, cooperative, appears stated age and no distress Lochia: appropriate Uterine Fundus: firm Incision: n/a DVT Evaluation: No evidence of DVT seen on physical exam. Negative Homan's sign. No cords or calf tenderness. No significant calf/ankle edema.   Basename 06/15/11 1000  HGB 11.2*  HCT 32.5*    Assessment/Plan: Discharge home and Breastfeeding   LOS: 4 days   Zerita Boers 06/17/2011, 7:00 AM

## 2011-06-17 NOTE — Discharge Summary (Signed)
RN reviewed discharge instructions with patient and gave her prescriptions provided by MD.  She had no complaints at time of discharge and was eager to leave. She was walked off the unit by RN. She left the campus in a private vehicle with her significant other.

## 2011-06-17 NOTE — Discharge Summary (Signed)
Obstetric Discharge Summary Reason for Admission: onset of labor, severe preeclampsia Prenatal Procedures: ultrasound Intrapartum Procedures: spontaneous vaginal delivery and tubal ligation Postpartum Procedures: P.P. tubal ligation Complications-Operative and Postpartum: pre-eclampsia Hemoglobin  Date Value Range Status  06/15/2011 11.2* 12.0-15.0 (g/dL) Final     HCT  Date Value Range Status  06/15/2011 32.5* 36.0-46.0 (%) Final    Discharge Diagnoses: Premature labor and Preelampsia  Discharge Information: Date: 06/17/2011 Activity: pelvic rest Diet: routine Medications: Ibuprofen, Percocet and labetalol 400 TID, Norvasc 10 q day Condition: stable and improved Instructions: refer to practice specific booklet Discharge to: home   Newborn Data: Live born female  Birth Weight: 3 lb 0.7 oz (1381 g) APGAR: 8, 8  Home with NICU.  Zerita Boers 06/17/2011, 7:02 AM

## 2011-06-18 NOTE — Discharge Summary (Signed)
Agree with above note.  Erika Eaton H. 06/18/2011 5:08 PM

## 2012-12-22 ENCOUNTER — Ambulatory Visit: Payer: Self-pay | Admitting: Obstetrics

## 2013-01-14 ENCOUNTER — Ambulatory Visit: Payer: Self-pay | Admitting: Obstetrics

## 2013-01-19 ENCOUNTER — Ambulatory Visit: Payer: Self-pay | Admitting: Obstetrics

## 2013-01-19 ENCOUNTER — Encounter (HOSPITAL_COMMUNITY): Payer: Self-pay | Admitting: Emergency Medicine

## 2013-01-19 ENCOUNTER — Emergency Department (HOSPITAL_COMMUNITY): Payer: BC Managed Care – PPO

## 2013-01-19 ENCOUNTER — Emergency Department (HOSPITAL_COMMUNITY)
Admission: EM | Admit: 2013-01-19 | Discharge: 2013-01-19 | Disposition: A | Discharge status: Home / Self Care | Payer: BC Managed Care – PPO | Attending: Emergency Medicine | Admitting: Emergency Medicine

## 2013-01-19 DIAGNOSIS — Z3202 Encounter for pregnancy test, result negative: Secondary | ICD-10-CM | POA: Insufficient documentation

## 2013-01-19 DIAGNOSIS — N739 Female pelvic inflammatory disease, unspecified: Secondary | ICD-10-CM | POA: Insufficient documentation

## 2013-01-19 DIAGNOSIS — I10 Essential (primary) hypertension: Secondary | ICD-10-CM | POA: Insufficient documentation

## 2013-01-19 DIAGNOSIS — Z8619 Personal history of other infectious and parasitic diseases: Secondary | ICD-10-CM | POA: Insufficient documentation

## 2013-01-19 DIAGNOSIS — N72 Inflammatory disease of cervix uteri: Secondary | ICD-10-CM | POA: Insufficient documentation

## 2013-01-19 DIAGNOSIS — R42 Dizziness and giddiness: Secondary | ICD-10-CM | POA: Insufficient documentation

## 2013-01-19 DIAGNOSIS — Z79899 Other long term (current) drug therapy: Secondary | ICD-10-CM | POA: Insufficient documentation

## 2013-01-19 DIAGNOSIS — N898 Other specified noninflammatory disorders of vagina: Secondary | ICD-10-CM | POA: Insufficient documentation

## 2013-01-19 DIAGNOSIS — R109 Unspecified abdominal pain: Secondary | ICD-10-CM | POA: Insufficient documentation

## 2013-01-19 LAB — CBC WITH DIFFERENTIAL/PLATELET
Basophils Absolute: 0 10*3/uL (ref 0.0–0.1)
Basophils Relative: 0 % (ref 0–1)
Hemoglobin: 12.4 g/dL (ref 12.0–15.0)
MCHC: 34.8 g/dL (ref 30.0–36.0)
Neutro Abs: 7.3 10*3/uL (ref 1.7–7.7)
Neutrophils Relative %: 83 % — ABNORMAL HIGH (ref 43–77)
Platelets: 195 10*3/uL (ref 150–400)
RDW: 13.5 % (ref 11.5–15.5)

## 2013-01-19 LAB — URINALYSIS, ROUTINE W REFLEX MICROSCOPIC
Bilirubin Urine: NEGATIVE
Glucose, UA: NEGATIVE mg/dL
Hgb urine dipstick: NEGATIVE
Specific Gravity, Urine: 1.016 (ref 1.005–1.030)
Urobilinogen, UA: 1 mg/dL (ref 0.0–1.0)

## 2013-01-19 LAB — COMPREHENSIVE METABOLIC PANEL
AST: 15 U/L (ref 0–37)
Albumin: 4.1 g/dL (ref 3.5–5.2)
Alkaline Phosphatase: 41 U/L (ref 39–117)
Chloride: 99 mEq/L (ref 96–112)
Potassium: 3.5 mEq/L (ref 3.5–5.1)
Sodium: 134 mEq/L — ABNORMAL LOW (ref 135–145)
Total Bilirubin: 0.6 mg/dL (ref 0.3–1.2)
Total Protein: 7.8 g/dL (ref 6.0–8.3)

## 2013-01-19 LAB — WET PREP, GENITAL
Trich, Wet Prep: NONE SEEN
Yeast Wet Prep HPF POC: NONE SEEN

## 2013-01-19 MED ORDER — METRONIDAZOLE 500 MG PO TABS: 500.0000 mg | ORAL_TABLET | Freq: Two times a day (BID) | ORAL | Status: DC

## 2013-01-19 MED ORDER — DOXYCYCLINE HYCLATE 100 MG PO CAPS: 100.0000 mg | ORAL_CAPSULE | Freq: Two times a day (BID) | ORAL | Status: DC

## 2013-01-19 MED ORDER — CEFTRIAXONE SODIUM 250 MG IJ SOLR
250.0000 mg | Freq: Once | INTRAMUSCULAR | Status: AC
  Administered 2013-01-19: 250 mg via INTRAMUSCULAR
  Filled 2013-01-19: qty 250

## 2013-01-19 MED ORDER — HYDROMORPHONE HCL PF 2 MG/ML IJ SOLN: 2.0000 mg | Freq: Once | INTRAMUSCULAR | Status: DC

## 2013-01-19 MED ORDER — ONDANSETRON 4 MG PO TBDP: 8.0000 mg | ORAL_TABLET | Freq: Once | ORAL | Status: DC

## 2013-01-19 MED ORDER — ONDANSETRON HCL 4 MG/2ML IJ SOLN
4.0000 mg | Freq: Once | INTRAMUSCULAR | Status: AC
  Administered 2013-01-19: 4 mg via INTRAVENOUS

## 2013-01-19 MED ORDER — ONDANSETRON HCL 4 MG/2ML IJ SOLN
4.0000 mg | Freq: Once | INTRAMUSCULAR | Status: DC
  Filled 2013-01-19: qty 2

## 2013-01-19 MED ORDER — HYDROMORPHONE HCL PF 1 MG/ML IJ SOLN
1.0000 mg | Freq: Once | INTRAMUSCULAR | Status: DC
  Filled 2013-01-19: qty 1

## 2013-01-19 MED ORDER — AZITHROMYCIN 250 MG PO TABS
1000.0000 mg | ORAL_TABLET | Freq: Once | ORAL | Status: AC
  Administered 2013-01-19: 1000 mg via ORAL
  Filled 2013-01-19: qty 4

## 2013-01-19 MED ORDER — LIDOCAINE HCL (PF) 1 % IJ SOLN
INTRAMUSCULAR | Status: AC
  Administered 2013-01-19: 2 mL
  Filled 2013-01-19: qty 5

## 2013-01-19 MED ORDER — HYDROMORPHONE HCL PF 1 MG/ML IJ SOLN
1.0000 mg | Freq: Once | INTRAMUSCULAR | Status: AC
  Administered 2013-01-19: 1 mg via INTRAVENOUS

## 2013-01-19 NOTE — ED Notes (Signed)
Pt discharged.Vital signs stable and GCS 15 

## 2013-01-19 NOTE — ED Provider Notes (Signed)
Medical screening examination/treatment/procedure(s) were performed by non-physician practitioner and as supervising physician I was immediately available for consultation/collaboration.   Charles B. Bernette Mayers, MD 01/19/13 1329

## 2013-01-19 NOTE — ED Notes (Signed)
Pt says she feels dizzy.Vital signs stable and GCS15

## 2013-01-19 NOTE — ED Notes (Signed)
Pt c/o lower abd pain with vaginal discharge starting this am; pt unsure if could have UTI; pt sts LMP was end of April

## 2013-01-19 NOTE — ED Notes (Signed)
Pt feels better

## 2013-01-19 NOTE — ED Provider Notes (Signed)
History     CSN: [id number]  Arrival date & time 01/19/13  1042   First MD Initiated Contact with Patient 01/19/13 1106      Chief Complaint  Patient presents with  . Abdominal Pain  . Vaginal Discharge    (Consider location/radiation/quality/duration/timing/severity/associated sxs/prior treatment) HPI Comments: 37 year old female with a past medical history of hypertension and herpes simplex presents to the emergency department complaining of sudden onset lower abdominal pain a few hours ago while at work. Patient states she was lifting of trays at work when the pain began, got dizzy, described as sharp rated 9/10. Pain is constant, unrelieved by ibuprofen and Gas-X. Admits to associated thick vaginal discharge beginning this morning when she woke up along with nausea. Denies fever, chills, vomiting, urinary or bowel changes. Last menstrual period began April 20 and was normal. Patient is sexually active with one partner, not on birth control and does not use protection. She had a tubal ligation in 2012, however did get pregnant a few months after the tubal ligation.  Patient is a 37 y.o. female presenting with abdominal pain and vaginal discharge. The history is provided by the patient.  Abdominal Pain Associated symptoms: nausea and vaginal discharge   Associated symptoms: no chills, no constipation, no diarrhea, no dysuria, no fever, no hematuria, no vaginal bleeding and no vomiting   Vaginal Discharge Associated symptoms include abdominal pain and nausea. Pertinent negatives include no chills, fever, headaches, vomiting or weakness.    Past Medical History  Diagnosis Date  . Hypertension   . Abnormal Pap smear   . Herpes simplex     Past Surgical History  Procedure Laterality Date  . Eye surgery    . Tubal ligation    . Tubal ligation  06/15/2011    Procedure: POST PARTUM TUBAL LIGATION;  Surgeon: Scheryl Darter, MD;  Location: [facility];  Service: Gynecology;  Laterality:  Bilateral;  bilateral post partum tubal ligation with filshie clips.    History reviewed. No pertinent family history.  History  Substance Use Topics  . Smoking status: Never Smoker   . Smokeless tobacco: Not on file  . Alcohol Use: No    OB History   Grav Para Term Preterm Abortions TAB SAB Ect Mult Living   4 4 2 2      4       Review of Systems  Constitutional: Negative for fever and chills.  Gastrointestinal: Positive for nausea and abdominal pain. Negative for vomiting, diarrhea and constipation.  Genitourinary: Positive for vaginal discharge and pelvic pain. Negative for dysuria, urgency, frequency, hematuria, vaginal bleeding, vaginal pain and menstrual problem.  Neurological: Positive for dizziness. Negative for weakness, light-headedness and headaches.  All other systems reviewed and are negative.    Allergies  Chocolate and Soda lime  Home Medications   Current Outpatient Rx  Name  Route  Sig  Dispense  Refill  . EXPIRED: amLODipine (NORVASC) 10 MG tablet   Oral   Take 1 tablet (10 mg total) by mouth daily.   30 tablet   3   . EXPIRED: labetalol (NORMODYNE) 200 MG tablet   Oral   Take 2 tablets (400 mg total) by mouth 3 (three) times daily.   180 tablet   2     BP 155/106  Pulse 78  Temp(Src) 98.5 F (36.9 C) (Oral)  Resp 18  SpO2 100%  Physical Exam  Nursing note and vitals reviewed. Constitutional: She is oriented to person, place, and time. She  appears well-developed and well-nourished. No distress.  Appears uncomfortable.  HENT:  Head: Normocephalic and atraumatic.  Mouth/Throat: Oropharynx is clear and moist.  Eyes: Conjunctivae are normal.  Neck: Normal range of motion. Neck supple.  Cardiovascular: Normal rate, regular rhythm and normal heart sounds.   Pulmonary/Chest: Effort normal and breath sounds normal.  Abdominal: Soft. Normal appearance and bowel sounds are normal. There is tenderness in the suprapubic area. There is guarding.  There is no rigidity, no rebound and no CVA tenderness.    Genitourinary: Uterus is tender. Uterus is not deviated, not enlarged and not fixed. Cervix exhibits motion tenderness and discharge (thick, white). Cervix exhibits no friability. Right adnexum displays tenderness. Right adnexum displays no mass and no fullness. Left adnexum displays tenderness. Left adnexum displays no mass and no fullness. No erythema, tenderness or bleeding around the vagina. Vaginal discharge (thick, white) found.  Musculoskeletal: Normal range of motion. She exhibits no edema.  Neurological: She is alert and oriented to person, place, and time.  Skin: Skin is warm and dry. She is not diaphoretic.  Psychiatric: She has a normal mood and affect. Her behavior is normal.    ED Course  Procedures (including critical care time)  Labs Reviewed  WET PREP, GENITAL - Abnormal; Notable for the following:    Clue Cells Wet Prep HPF POC FEW (*)    WBC, Wet Prep HPF POC FEW (*)    All other components within normal limits  CBC WITH DIFFERENTIAL - Abnormal; Notable for the following:    HCT 35.6 (*)    MCV 76.1 (*)    Neutrophils Relative 83 (*)    All other components within normal limits  COMPREHENSIVE METABOLIC PANEL - Abnormal; Notable for the following:    Sodium 134 (*)    All other components within normal limits  GC/CHLAMYDIA PROBE AMP  URINALYSIS, ROUTINE W REFLEX MICROSCOPIC  URINALYSIS, MICROSCOPIC ONLY  POCT PREGNANCY, URINE   US Transvaginal Non-ob  01/19/2013  *RADIOLOGY REPORT*  Clinical Data: 37 year old female with pelvic pain and vaginal discharge.  TRANSABDOMINAL AND TRANSVAGINAL ULTRASOUND OF PELVIS Technique:  Both transabdominal and transvaginal ultrasound examinations of the pelvis were performed. Transabdominal technique was performed for global imaging of the pelvis including uterus, ovaries, adnexal regions, and pelvic cul-de-sac.  It was necessary to proceed with endovaginal exam following the  transabdominal exam to visualize the ovaries and endometrium.  Comparison:  None  Findings:  Uterus: The uterus is anteverted measuring 8.5 x 4.9 x 6.6 cm.  No focal uterine masses are identified.  Endometrium: The endometrium is homogeneous measuring 11 mm in thickness.  Right ovary:  The right ovary measures 4.2 x 2.5 x 2.7 cm. A few small follicles/cysts are noted with one collapsing cyst present. No suspicious masses are identified.  Left ovary: The left ovary is unremarkable measuring 2.3 x 1.5 x 1.8 cm.  Other findings: A small amount of free pelvic fluid is identified. There is no evidence of solid or cystic adnexal mass.  IMPRESSION: Small amount of free pelvic fluid without evidence of focal abscess.  No other significant abnormalities identified.   Original Report Authenticated By: Harmon Pier, M.D.    US Pelvis Complete  01/19/2013  *RADIOLOGY REPORT*  Clinical Data: 37 year old female with pelvic pain and vaginal discharge.  TRANSABDOMINAL AND TRANSVAGINAL ULTRASOUND OF PELVIS Technique:  Both transabdominal and transvaginal ultrasound examinations of the pelvis were performed. Transabdominal technique was performed for global imaging of the pelvis including uterus, ovaries, adnexal regions,  and pelvic cul-de-sac.  It was necessary to proceed with endovaginal exam following the transabdominal exam to visualize the ovaries and endometrium.  Comparison:  None  Findings:  Uterus: The uterus is anteverted measuring 8.5 x 4.9 x 6.6 cm.  No focal uterine masses are identified.  Endometrium: The endometrium is homogeneous measuring 11 mm in thickness.  Right ovary:  The right ovary measures 4.2 x 2.5 x 2.7 cm. A few small follicles/cysts are noted with one collapsing cyst present. No suspicious masses are identified.  Left ovary: The left ovary is unremarkable measuring 2.3 x 1.5 x 1.8 cm.  Other findings: A small amount of free pelvic fluid is identified. There is no evidence of solid or cystic adnexal mass.   IMPRESSION: Small amount of free pelvic fluid without evidence of focal abscess.  No other significant abnormalities identified.   Original Report Authenticated By: Harmon Pier, M.D.      1. PID (pelvic inflammatory disease)   2. Abdominal pain   3. Cervicitis       MDM  37 y/o female with abdominal pain and vaginal disharge. Positive CMT on exam, bilateral adnexal tenderness. Pelvic US obtained to r/o pelvic abscess which is not present on [place]. Small amount of free pelvic fluid present. Concern for PID. Also discussed STD's with patient and she states she just got married, but can never be sure. 250 mg IM rocephin and 1 g PO azithromycin given. Dilaudid for pain, zofran for nausea. Appears more comfortable over time in ED. Abdomen less tender to palpation on re-examination. Will discharge with doxycycline and flagyl. Return precautions discussed. She has a f/u with her GYN later today. Patient states understanding of plan and is agreeable.         Trevor Mace, PA-C 01/19/13 1320

## 2013-01-20 LAB — GC/CHLAMYDIA PROBE AMP
CT Probe RNA: NEGATIVE
GC Probe RNA: NEGATIVE

## 2014-01-17 ENCOUNTER — Emergency Department (HOSPITAL_COMMUNITY)
Admission: EM | Admit: 2014-01-17 | Discharge: 2014-01-17 | Disposition: A | Discharge status: Home / Self Care | Payer: BC Managed Care – PPO | Attending: Emergency Medicine | Admitting: Emergency Medicine

## 2014-01-17 ENCOUNTER — Encounter (HOSPITAL_COMMUNITY): Payer: Self-pay | Admitting: Emergency Medicine

## 2014-01-17 DIAGNOSIS — Z8619 Personal history of other infectious and parasitic diseases: Secondary | ICD-10-CM | POA: Insufficient documentation

## 2014-01-17 DIAGNOSIS — I10 Essential (primary) hypertension: Secondary | ICD-10-CM | POA: Insufficient documentation

## 2014-01-17 DIAGNOSIS — R519 Headache, unspecified: Secondary | ICD-10-CM

## 2014-01-17 DIAGNOSIS — R42 Dizziness and giddiness: Secondary | ICD-10-CM | POA: Insufficient documentation

## 2014-01-17 DIAGNOSIS — R51 Headache: Secondary | ICD-10-CM | POA: Insufficient documentation

## 2014-01-17 MED ORDER — METOCLOPRAMIDE HCL 5 MG/ML IJ SOLN
10.0000 mg | Freq: Once | INTRAMUSCULAR | Status: AC
  Administered 2014-01-17: 10 mg via INTRAVENOUS
  Filled 2014-01-17: qty 2

## 2014-01-17 MED ORDER — IBUPROFEN 600 MG PO TABS: 600.0000 mg | ORAL_TABLET | Freq: Four times a day (QID) | ORAL | Status: DC | PRN

## 2014-01-17 MED ORDER — SODIUM CHLORIDE 0.9 % IV BOLUS (SEPSIS)
1000.0000 mL | Freq: Once | INTRAVENOUS | Status: AC
  Administered 2014-01-17: 1000 mL via INTRAVENOUS

## 2014-01-17 MED ORDER — KETOROLAC TROMETHAMINE 30 MG/ML IJ SOLN
30.0000 mg | Freq: Once | INTRAMUSCULAR | Status: AC
  Administered 2014-01-17: 30 mg via INTRAVENOUS
  Filled 2014-01-17: qty 1

## 2014-01-17 MED ORDER — DIPHENHYDRAMINE HCL 50 MG/ML IJ SOLN
25.0000 mg | Freq: Once | INTRAMUSCULAR | Status: AC
  Administered 2014-01-17: 25 mg via INTRAVENOUS
  Filled 2014-01-17: qty 1

## 2014-01-17 NOTE — ED Provider Notes (Signed)
CSN: [id number]     Arrival date & time 01/17/14  0747 History   First MD Initiated Contact with Patient 01/17/14 [phone]     Chief Complaint  Patient presents with  . Headache  . Hypertension     (Consider location/radiation/quality/duration/timing/severity/associated sxs/prior Treatment) HPI Comments: Pt comes in with cc of headaches. Headaches started yday afternoon, are bilateral temporal, throbbing and constant. Patient has no specific aggravating or relieving factors. Pt has no associated nausea, vomiting, visual complains, seizures, altered mental status, loss of consciousness, new weakness, or numbness, no gait instability. 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 No hx of headaches, no trauma, no neck pain, no fevers, no family hx of any brain tumors, or aneurysms.   Patient is a 38 y.o. female presenting with headaches and hypertension. The history is provided by the patient.  Headache Associated symptoms: dizziness   Associated symptoms: no abdominal pain, no nausea, no neck pain and no vomiting   Hypertension Associated symptoms include headaches. Pertinent negatives include no chest pain, no abdominal pain and no shortness of breath.    Past Medical History  Diagnosis Date  . Hypertension   . Abnormal Pap smear   . Herpes simplex    Past Surgical History  Procedure Laterality Date  . Eye surgery    . Tubal ligation    . Tubal ligation  06/15/2011    Procedure: POST PARTUM TUBAL LIGATION;  Surgeon: Emeterio Reeve, MD;  Location: [facility];  Service: Gynecology;  Laterality: Bilateral;  bilateral post partum tubal ligation with filshie clips.   No family history on file. History  Substance Use Topics  . Smoking status: Never Smoker   . Smokeless tobacco: Not on file  . Alcohol Use: Yes     Comment: occasionally, not regularly    OB History   Grav Para Term Preterm Abortions TAB SAB Ect Mult Living   4 4 2  2      4      Review of Systems  Constitutional: Negative for activity change.  Respiratory: Negative for shortness of breath.   Cardiovascular: Negative for chest pain.  Gastrointestinal: Negative for nausea, vomiting and abdominal pain.  Genitourinary: Negative for dysuria.  Musculoskeletal: Negative for neck pain.  Neurological: Positive for dizziness and headaches.  All other systems reviewed and are negative.     Allergies  Chocolate and Soda lime  Home Medications   Prior to Admission medications   Medication Sig Start Date End Date Taking? Authorizing Provider  naproxen sodium (ALEVE) 220 MG tablet Take 220 mg by mouth 2 (two) times daily as needed (for pain).   Yes Historical Provider, MD  ibuprofen (ADVIL,MOTRIN) 600 MG tablet Take 1 tablet (600 mg total) by mouth every 6 (six) hours as needed. 01/17/14   Jerrilyn Messinger Kathrynn Humble, MD   BP 123/95  Pulse 61  Temp(Src) 98.9 F (37.2 C) (Oral)  Resp 16  Ht 5\' 6"  (1.676 m)  Wt 191 lb (86.637 kg)  BMI 30.84 kg/m2  SpO2 98%  LMP 01/14/2014  Breastfeeding? No Physical Exam  Nursing note and vitals reviewed. Constitutional: She is oriented to person, place, and time. She appears well-developed and well-nourished.  HENT:  Head: Normocephalic and atraumatic.  Eyes: EOM are normal. Pupils are equal, round, and reactive to light.  Neck: Neck supple.  Cardiovascular: Normal rate, regular rhythm and normal heart sounds.   No murmur  heard. Pulmonary/Chest: Effort normal. No respiratory distress.  Abdominal: Soft. She exhibits no distension. There is no tenderness. There is no rebound and no guarding.  Neurological: She is alert and oriented to person, place, and time.  Cerebellar exam is normal (finger to nose) Sensory exam normal for bilateral upper and lower extremities - and patient is able to discriminate between sharp and dull. Motor exam is 4+/5   Skin: Skin is warm and dry.    ED Course  Procedures (including critical care  time) Labs Review Labs Reviewed - No data to display  Imaging Review No results found.   EKG Interpretation None      MDM   Final diagnoses:  Headache   DDX includes: Primary headaches - including migrainous headaches, cluster headaches, tension headaches. ICH Carotid dissection Cavernous sinus thrombosis Meningitis Encephalitis Sinusitis Tumor Vascular headaches AV malformation Brain aneurysm Muscular headaches  A/P: Pt comes in with cc of headaches. No concerns for life threatening secondary headaches because the headaches have now been present for several hours, and there are no red flags for brain bleeds, or infection. Pt is very healthy, and has no medical problems - her diastolic is elevated, but otherwise her BP is not truly concerning. Neuro exam is normal - and there is no bruits, or signs of dissection.  We gave her headache meds iv -and she feels a lot better. Will d.c her, return precautions have been verbally discussed - neuro f/u given ONLY if her headaches dont improve and are persistent and severe.   Varney Biles, MD 01/17/14 1242

## 2014-01-17 NOTE — ED Notes (Signed)
Patient reports that she has had high blood pressure and headache since yesterday around 3:00 pm.  States that the nurse at work checked her BP and it was 156/112, states that she has had some blurry vision.  No unilateral weakness or facial droop noted, no slurred speech noted.

## 2014-01-17 NOTE — Discharge Instructions (Signed)
We saw you in the ER for headaches. All the labs and imaging are normal. We are not sure what is causing your headaches, however, there appears to be no evidence of infection, bleeds or tumors based on our exam and results.  Please take motrin round the clock for the next 6 hours, and take other meds prescribed only for break through pain. See your doctor if the pain persists, as you might need better medications or a specialist.  Please return to the ER if your symptoms worsen; you have increased pain, fevers, chills, inability to keep any medications down, confusion. Otherwise see the outpatient doctor as requested.   Headaches, Frequently Asked Questions MIGRAINE HEADACHES Q: What is migraine? What causes it? How can I treat it? A: Generally, migraine headaches begin as a dull ache. Then they develop into a constant, throbbing, and pulsating pain. You may experience pain at the temples. You may experience pain at the front or back of one or both sides of the head. The pain is usually accompanied by a combination of:  Nausea.  Vomiting.  Sensitivity to light and noise. Some people (about 15%) experience an aura (see below) before an attack. The cause of migraine is believed to be chemical reactions in the brain. Treatment for migraine may include over-the-counter or prescription medications. It may also include self-help techniques. These include relaxation training and biofeedback.  Q: What is an aura? A: About 15% of people with migraine get an "aura". This is a sign of neurological symptoms that occur before a migraine headache. You may see wavy or jagged lines, dots, or flashing lights. You might experience tunnel vision or blind spots in one or both eyes. The aura can include visual or auditory hallucinations (something imagined). It may include disruptions in smell (such as strange odors), taste or touch. Other symptoms include:  Numbness.  A "pins and needles"  sensation.  Difficulty in recalling or speaking the correct word. These neurological events may last as long as 60 minutes. These symptoms will fade as the headache begins. Q: What is a trigger? A: Certain physical or environmental factors can lead to or "trigger" a migraine. These include:  Foods.  Hormonal changes.  Weather.  Stress. It is important to remember that triggers are different for everyone. To help prevent migraine attacks, you need to figure out which triggers affect you. Keep a headache diary. This is a good way to track triggers. The diary will help you talk to your healthcare professional about your condition. Q: Does weather affect migraines? A: Bright sunshine, hot, humid conditions, and drastic changes in barometric pressure may lead to, or "trigger," a migraine attack in some people. But studies have shown that weather does not act as a trigger for everyone with migraines. Q: What is the link between migraine and hormones? A: Hormones start and regulate many of your body's functions. Hormones keep your body in balance within a constantly changing environment. The levels of hormones in your body are unbalanced at times. Examples are during menstruation, pregnancy, or menopause. That can lead to a migraine attack. In fact, about three quarters of all women with migraine report that their attacks are related to the menstrual cycle.  Q: Is there an increased risk of stroke for migraine sufferers? A: The likelihood of a migraine attack causing a stroke is very remote. That is not to say that migraine sufferers cannot have a stroke associated with their migraines. In persons under age 61, the most common  associated factor for stroke is migraine headache. But over the course of a person's normal life span, the occurrence of migraine headache may actually be associated with a reduced risk of dying from cerebrovascular disease due to stroke.  Q: What are acute medications for  migraine? A: Acute medications are used to treat the pain of the headache after it has started. Examples over-the-counter medications, NSAIDs, ergots, and triptans.  Q: What are the triptans? A: Triptans are the newest class of abortive medications. They are specifically targeted to treat migraine. Triptans are vasoconstrictors. They moderate some chemical reactions in the brain. The triptans work on receptors in your brain. Triptans help to restore the balance of a neurotransmitter called serotonin. Fluctuations in levels of serotonin are thought to be a main cause of migraine.  Q: Are over-the-counter medications for migraine effective? A: Over-the-counter, or "OTC," medications may be effective in relieving mild to moderate pain and associated symptoms of migraine. But you should see your caregiver before beginning any treatment regimen for migraine.  Q: What are preventive medications for migraine? A: Preventive medications for migraine are sometimes referred to as "prophylactic" treatments. They are used to reduce the frequency, severity, and length of migraine attacks. Examples of preventive medications include antiepileptic medications, antidepressants, beta-blockers, calcium channel blockers, and NSAIDs (nonsteroidal anti-inflammatory drugs). Q: Why are anticonvulsants used to treat migraine? A: During the past few years, there has been an increased interest in antiepileptic drugs for the prevention of migraine. They are sometimes referred to as "anticonvulsants". Both epilepsy and migraine may be caused by similar reactions in the brain.  Q: Why are antidepressants used to treat migraine? A: Antidepressants are typically used to treat people with depression. They may reduce migraine frequency by regulating chemical levels, such as serotonin, in the brain.  Q: What alternative therapies are used to treat migraine? A: The term "alternative therapies" is often used to describe treatments  considered outside the scope of conventional Western medicine. Examples of alternative therapy include acupuncture, acupressure, and yoga. Another common alternative treatment is herbal therapy. Some herbs are believed to relieve headache pain. Always discuss alternative therapies with your caregiver before proceeding. Some herbal products contain arsenic and other toxins. TENSION HEADACHES Q: What is a tension-type headache? What causes it? How can I treat it? A: Tension-type headaches occur randomly. They are often the result of temporary stress, anxiety, fatigue, or anger. Symptoms include soreness in your temples, a tightening band-like sensation around your head (a "vice-like" ache). Symptoms can also include a pulling feeling, pressure sensations, and contracting head and neck muscles. The headache begins in your forehead, temples, or the back of your head and neck. Treatment for tension-type headache may include over-the-counter or prescription medications. Treatment may also include self-help techniques such as relaxation training and biofeedback. CLUSTER HEADACHES Q: What is a cluster headache? What causes it? How can I treat it? A: Cluster headache gets its name because the attacks come in groups. The pain arrives with little, if any, warning. It is usually on one side of the head. A tearing or bloodshot eye and a runny nose on the same side of the headache may also accompany the pain. Cluster headaches are believed to be caused by chemical reactions in the brain. They have been described as the most severe and intense of any headache type. Treatment for cluster headache includes prescription medication and oxygen. SINUS HEADACHES Q: What is a sinus headache? What causes it? How can I treat it? A: When  a cavity in the bones of the face and skull (a sinus) becomes inflamed, the inflammation will cause localized pain. This condition is usually the result of an allergic reaction, a tumor, or an  infection. If your headache is caused by a sinus blockage, such as an infection, you will probably have a fever. An x-ray will confirm a sinus blockage. Your caregiver's treatment might include antibiotics for the infection, as well as antihistamines or decongestants.  REBOUND HEADACHES Q: What is a rebound headache? What causes it? How can I treat it? A: A pattern of taking acute headache medications too often can lead to a condition known as "rebound headache." A pattern of taking too much headache medication includes taking it more than 2 days per week or in excessive amounts. That means more than the label or a caregiver advises. With rebound headaches, your medications not only stop relieving pain, they actually begin to cause headaches. Doctors treat rebound headache by tapering the medication that is being overused. Sometimes your caregiver will gradually substitute a different type of treatment or medication. Stopping may be a challenge. Regularly overusing a medication increases the potential for serious side effects. Consult a caregiver if you regularly use headache medications more than 2 days per week or more than the label advises. ADDITIONAL QUESTIONS AND ANSWERS Q: What is biofeedback? A: Biofeedback is a self-help treatment. Biofeedback uses special equipment to monitor your body's involuntary physical responses. Biofeedback monitors:  Breathing.  Pulse.  Heart rate.  Temperature.  Muscle tension.  Brain activity. Biofeedback helps you refine and perfect your relaxation exercises. You learn to control the physical responses that are related to stress. Once the technique has been mastered, you do not need the equipment any more. Q: Are headaches hereditary? A: Four out of five (80%) of people that suffer report a family history of migraine. Scientists are not sure if this is genetic or a family predisposition. Despite the uncertainty, a child has a 50% chance of having migraine if  one parent suffers. The child has a 75% chance if both parents suffer.  Q: Can children get headaches? A: By the time they reach high school, most young people have experienced some type of headache. Many safe and effective approaches or medications can prevent a headache from occurring or stop it after it has begun.  Q: What type of doctor should I see to diagnose and treat my headache? A: Start with your primary caregiver. Discuss his or her experience and approach to headaches. Discuss methods of classification, diagnosis, and treatment. Your caregiver may decide to recommend you to a headache specialist, depending upon your symptoms or other physical conditions. Having diabetes, allergies, etc., may require a more comprehensive and inclusive approach to your headache. The National Headache Foundation will provide, upon request, a list of [facility] physician members in your state. Document Released: 11/23/2003 Document Revised: 11/25/2011 Document Reviewed: 05/02/2008 [facility] Patient Information 2014 [place].

## 2014-01-17 NOTE — ED Notes (Signed)
Dr. Kathrynn Humble informed of patient's complaint and vitals.

## 2014-07-18 ENCOUNTER — Encounter (HOSPITAL_COMMUNITY): Payer: Self-pay | Admitting: Emergency Medicine

## 2014-10-14 ENCOUNTER — Other Ambulatory Visit: Payer: Self-pay

## 2014-10-17 LAB — CYTOLOGY - PAP

## 2014-10-19 ENCOUNTER — Emergency Department (HOSPITAL_COMMUNITY)
Admission: EM | Admit: 2014-10-19 | Discharge: 2014-10-19 | Disposition: A | Discharge status: Home / Self Care | Payer: BLUE CROSS/BLUE SHIELD | Attending: Emergency Medicine | Admitting: Emergency Medicine

## 2014-10-19 ENCOUNTER — Encounter (HOSPITAL_COMMUNITY): Payer: Self-pay | Admitting: Emergency Medicine

## 2014-10-19 DIAGNOSIS — R42 Dizziness and giddiness: Secondary | ICD-10-CM | POA: Diagnosis not present

## 2014-10-19 DIAGNOSIS — R05 Cough: Secondary | ICD-10-CM | POA: Insufficient documentation

## 2014-10-19 DIAGNOSIS — Z3202 Encounter for pregnancy test, result negative: Secondary | ICD-10-CM | POA: Insufficient documentation

## 2014-10-19 DIAGNOSIS — Z9851 Tubal ligation status: Secondary | ICD-10-CM | POA: Insufficient documentation

## 2014-10-19 DIAGNOSIS — I10 Essential (primary) hypertension: Secondary | ICD-10-CM | POA: Diagnosis not present

## 2014-10-19 DIAGNOSIS — Z8619 Personal history of other infectious and parasitic diseases: Secondary | ICD-10-CM | POA: Insufficient documentation

## 2014-10-19 DIAGNOSIS — K529 Noninfective gastroenteritis and colitis, unspecified: Secondary | ICD-10-CM | POA: Insufficient documentation

## 2014-10-19 DIAGNOSIS — R1110 Vomiting, unspecified: Secondary | ICD-10-CM | POA: Diagnosis present

## 2014-10-19 DIAGNOSIS — A084 Viral intestinal infection, unspecified: Secondary | ICD-10-CM

## 2014-10-19 LAB — CBC WITH DIFFERENTIAL/PLATELET
Basophils Absolute: 0 10*3/uL (ref 0.0–0.1)
Basophils Relative: 0 % (ref 0–1)
Eosinophils Absolute: 0 10*3/uL (ref 0.0–0.7)
Eosinophils Relative: 0 % (ref 0–5)
HCT: 37.1 % (ref 36.0–46.0)
Hemoglobin: 12.2 g/dL (ref 12.0–15.0)
Lymphocytes Relative: 5 % — ABNORMAL LOW (ref 12–46)
Lymphs Abs: 0.4 10*3/uL — ABNORMAL LOW (ref 0.7–4.0)
MCH: 25.5 pg — ABNORMAL LOW (ref 26.0–34.0)
MCHC: 32.9 g/dL (ref 30.0–36.0)
MCV: 77.6 fL — ABNORMAL LOW (ref 78.0–100.0)
Monocytes Absolute: 0.3 10*3/uL (ref 0.1–1.0)
Monocytes Relative: 4 % (ref 3–12)
Neutro Abs: 7.5 10*3/uL (ref 1.7–7.7)
Neutrophils Relative %: 91 % — ABNORMAL HIGH (ref 43–77)
Platelets: 184 10*3/uL (ref 150–400)
RBC: 4.78 MIL/uL (ref 3.87–5.11)
RDW: 14 % (ref 11.5–15.5)
WBC: 8.2 10*3/uL (ref 4.0–10.5)

## 2014-10-19 LAB — BASIC METABOLIC PANEL
Anion gap: 7 (ref 5–15)
BUN: 9 mg/dL (ref 6–23)
CO2: 26 mmol/L (ref 19–32)
Calcium: 8.7 mg/dL (ref 8.4–10.5)
Chloride: 105 mmol/L (ref 96–112)
Creatinine, Ser: 0.71 mg/dL (ref 0.50–1.10)
GFR calc Af Amer: >90 mL/min (ref >90)
GFR calc non Af Amer: >90 mL/min (ref >90)
Glucose, Bld: 110 mg/dL — ABNORMAL HIGH (ref 70–99)
Potassium: 3.3 mmol/L — ABNORMAL LOW (ref 3.5–5.1)
Sodium: 138 mmol/L (ref 135–145)

## 2014-10-19 LAB — URINE MICROSCOPIC-ADD ON

## 2014-10-19 LAB — PREGNANCY, URINE: Preg Test, Ur: NEGATIVE

## 2014-10-19 LAB — URINALYSIS, ROUTINE W REFLEX MICROSCOPIC
Bilirubin Urine: NEGATIVE
Glucose, UA: NEGATIVE mg/dL
Hgb urine dipstick: NEGATIVE
Ketones, ur: NEGATIVE mg/dL
Nitrite: NEGATIVE
Protein, ur: NEGATIVE mg/dL
Specific Gravity, Urine: 1.024 (ref 1.005–1.030)
Urobilinogen, UA: 1 mg/dL (ref 0.0–1.0)
pH: 6 (ref 5.0–8.0)

## 2014-10-19 MED ORDER — ONDANSETRON 4 MG PO TBDP
8.0000 mg | ORAL_TABLET | Freq: Once | ORAL | Status: AC
  Administered 2014-10-19: 8 mg via ORAL
  Filled 2014-10-19: qty 2

## 2014-10-19 MED ORDER — ONDANSETRON 4 MG PO TBDP: 4.0000 mg | ORAL_TABLET | Freq: Three times a day (TID) | ORAL | Status: DC | PRN

## 2014-10-19 MED ORDER — ACETAMINOPHEN 325 MG PO TABS
650.0000 mg | ORAL_TABLET | Freq: Once | ORAL | Status: AC
  Administered 2014-10-19: 650 mg via ORAL
  Filled 2014-10-19: qty 2

## 2014-10-19 NOTE — Discharge Instructions (Signed)
Viral Gastroenteritis °Viral gastroenteritis is also known as stomach flu. This condition affects the stomach and intestinal tract. It can cause sudden diarrhea and vomiting. The illness typically lasts 3 to 8 days. Most people develop an immune response that eventually gets rid of the virus. While this natural response develops, the virus can make you quite ill. °CAUSES  °Many different viruses can cause gastroenteritis, such as rotavirus or noroviruses. You can catch one of these viruses by consuming contaminated food or water. You may also catch a virus by sharing utensils or other personal items with an infected person or by touching a contaminated surface. °SYMPTOMS  °The most common symptoms are diarrhea and vomiting. These problems can cause a severe loss of body fluids (dehydration) and a body salt (electrolyte) imbalance. Other symptoms may include: °· Fever. °· Headache. °· Fatigue. °· Abdominal pain. °DIAGNOSIS  °Your caregiver can usually diagnose viral gastroenteritis based on your symptoms and a physical exam. A stool sample may also be taken to test for the presence of viruses or other infections. °TREATMENT  °This illness typically goes away on its own. Treatments are aimed at rehydration. The most serious cases of viral gastroenteritis involve vomiting so severely that you are not able to keep fluids down. In these cases, fluids must be given through an intravenous line (IV). °HOME CARE INSTRUCTIONS  °· Drink enough fluids to keep your urine clear or pale yellow. Drink small amounts of fluids frequently and increase the amounts as tolerated. °· Ask your caregiver for specific rehydration instructions. °· Avoid: °¨ Foods high in sugar. °¨ Alcohol. °¨ Carbonated drinks. °¨ Tobacco. °¨ Juice. °¨ Caffeine drinks. °¨ Extremely hot or cold fluids. °¨ Fatty, greasy foods. °¨ Too much intake of anything at one time. °¨ Dairy products until 24 to 48 hours after diarrhea stops. °· You may consume probiotics.  Probiotics are active cultures of beneficial bacteria. They may lessen the amount and number of diarrheal stools in adults. Probiotics can be found in yogurt with active cultures and in supplements. °· Wash your hands well to avoid spreading the virus. °· Only take over-the-counter or prescription medicines for pain, discomfort, or fever as directed by your caregiver. Do not give aspirin to children. Antidiarrheal medicines are not recommended. °· Ask your caregiver if you should continue to take your regular prescribed and over-the-counter medicines. °· Keep all follow-up appointments as directed by your caregiver. °SEEK IMMEDIATE MEDICAL CARE IF:  °· You are unable to keep fluids down. °· You do not urinate at least once every 6 to 8 hours. °· You develop shortness of breath. °· You notice blood in your stool or vomit. This may look like coffee grounds. °· You have abdominal pain that increases or is concentrated in one small area (localized). °· You have persistent vomiting or diarrhea. °· You have a fever. °· The patient is a child younger than 3 months, and he or she has a fever. °· The patient is a child older than 3 months, and he or she has a fever and persistent symptoms. °· The patient is a child older than 3 months, and he or she has a fever and symptoms suddenly get worse. °· The patient is a baby, and he or she has no tears when crying. °MAKE SURE YOU:  °· Understand these instructions. °· Will watch your condition. °· Will get help right away if you are not doing well or get worse. °Document Released: 09/02/2005 Document Revised: 11/25/2011 Document Reviewed: 06/19/2011 °  [employer] Patient Information 2015 [place]. This information is not intended to replace advice given to you by your health care provider. Make sure you discuss any questions you have with your health care provider.   No Primary Care Doctor:  Call Health Connect at [phone] - they can help you locate a primary care doctor  that accepts your insurance, provides certain services, etc.  Physician Referral [phone] Medication Assistance:  Organization Address Phone Notes  [facility] Medication Assistance Program  [address] [place], [place] [address]  [phone]  --Must be a resident of [facility]  -- Must have NO insurance coverage whatsoever (no Medicaid/ Medicare, etc.)  -- The pt. MUST have a primary care doctor that directs their care regularly and follows them in the community   [facility]   [phone]    [employer]   [phone]    Agencies that provide inexpensive medical care:  Organization Address Phone Notes  [place]   [phone]    [facility]   [phone]    [facility]  [place], [place] [address]  [phone]    [facility] [address]  [place]  [phone]    [facility]   [phone]    [facility]   [phone]    [place] [address], [place]  Phone: [phone], Fax: [phone]  Hours of Operation: 9 am - 6 pm, M-F. Also accepts Medicaid/Medicare and self-pay.   [facility] [place] [address], [address], [place]  Phone: [phone], Fax: [phone].  Hours of Operation: 8:30 am - 5:30 pm, M-F. Also accepts Medicaid and self-pay.   [facility]  [address] [place]  Phone: [phone]    [place], [place], [place]  [phone], Ext. [phone]  Mondays & Thursdays: 7-9 AM. First 15 patients are seen on a first come, first serve basis.   [place] Providers:  Organization Address Phone Notes  [facility]  [address], [place]  [phone]  Also accepts self-pay patients.   [facility]  [address],  [place]  [phone]    [place], [address], [place]  [phone]    [facility]  [address] [place]  [phone]    Lucianne Lei  [address], [place]  [phone]  Only accepts [place] [facility] [place] patients after they have their name applied to their card.   Self-Pay (no insurance) in [facility]:  Organization Address Phone Notes  Sickle Cell Patients, [facility]  [place]  [phone]    [facility]  [place]  [phone]    [facility]  [place], [place],   [phone]    [facility]/Dr. Osei-Bonsu  [address], [place] or [address], [place]  [phone]  Phone number for both [place] and [place] locations is the same.   [facility] and [facility]  [address], [place]  (  [place]) [phone]    [place], [place] or [address]  [phone]  [phone]    [facility]  [address], [place]  [phone], phone; [phone], fax   Sees patients 1st and 3rd Saturday of every month. Must not qualify for public or private insurance (i.e. Medicaid, Medicare, [place] Health Choice, Veterans' Benefits)   Household income should be no more than 200% of the poverty level The clinic cannot treat you if you are pregnant or think you are pregnant  Sexually transmitted diseases are not treated at the clinic.

## 2014-10-19 NOTE — ED Provider Notes (Signed)
CSN: [id number]     Arrival date & time 10/19/14  0915 History   First MD Initiated Contact with Patient 10/19/14 0935     Chief Complaint  Patient presents with  . Emesis  . Headache     (Consider location/radiation/quality/duration/timing/severity/associated sxs/prior Treatment) HPI Comments: Pt reports waking up feeling okay this morning, but while getting ready for work felt nauseas and vomited food contents that she ate last night, and also reports onset of watery diarrhea around 5-6am this morning. She went to work but continued to feel "funny", again vomited at work and at home after trying some ginger ale, so decided to come to the ED. Vomit is food contents only, no blood, no mucous/bile. She continues to feel nauseas and has a headache, as well as a "funny" feeling in her head. 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 She denies vaginal discharge, is sexually active since most recent period, has had 2 bilateral tubal ligations (became pregnant after first surgery).  Patient is a 39 y.o. female presenting with vomiting and headaches. The history is provided by the patient. No language interpreter was used.  Emesis Severity:  Moderate Duration:  3 hours Timing:  Intermittent Number of daily episodes:  3 Quality:  Stomach contents Progression:  Unchanged Chronicity:  New Recent urination:  Normal Context: not post-tussive and not self-induced   Relieved by:  None tried Worsened by:  Nothing tried Ineffective treatments:  Liquids Associated symptoms: cough, diarrhea and headaches   Associated symptoms: no abdominal pain, no chills, no fever, no sore throat and no URI   Diarrhea:    Quality:  Watery   Number of occurrences:  "many"   Severity:  Moderate   Duration:  5 hours   Timing:  Intermittent   Progression:  Unchanged Risk factors: sick contacts   Risk factors: no alcohol use, no diabetes and no suspect food intake   Headache Associated symptoms:  cough, diarrhea, nausea and vomiting   Associated symptoms: no abdominal pain, no congestion, no sore throat and no URI     Past Medical History  Diagnosis Date  . Hypertension   . Abnormal Pap smear   . Herpes simplex    Past Surgical History  Procedure Laterality Date  . Eye surgery    . Tubal ligation    . Tubal ligation  06/15/2011    Procedure: POST PARTUM TUBAL LIGATION;  Surgeon: Emeterio Reeve, MD;  Location: [facility];  Service: Gynecology;  Laterality: Bilateral;  bilateral post partum tubal ligation with filshie clips.   No family history on file. History  Substance Use Topics  . Smoking status: Never Smoker   . Smokeless tobacco: Not on file  . Alcohol Use: Yes     Comment: occasionally, not regularly    OB History    Gravida Para Term Preterm AB TAB SAB Ectopic Multiple Living   4 4 2 2      4      Review of Systems  Constitutional: Positive for appetite change. Negative for chills.  HENT: Negative for congestion and sore throat.   Eyes: Negative for visual disturbance.  Respiratory: Positive for cough. Negative for shortness of breath.   Cardiovascular: Negative for chest pain.  Gastrointestinal: Positive for nausea, vomiting and diarrhea. Negative for abdominal pain.  Genitourinary: Negative for dysuria, flank pain and vaginal discharge.  Neurological: Positive for light-headedness and headaches.  All other systems reviewed and are negative.     Allergies  Chocolate and Soda  lime  Home Medications   Prior to Admission medications   Medication Sig Start Date End Date Taking? Authorizing Provider  ibuprofen (ADVIL,MOTRIN) 600 MG tablet Take 1 tablet (600 mg total) by mouth every 6 (six) hours as needed. 01/17/14   Varney Biles, MD  naproxen sodium (ALEVE) 220 MG tablet Take 220 mg by mouth 2 (two) times daily as needed (for pain).    Historical Provider, MD   BP 137/102 mmHg  Pulse 98  Temp(Src) 99 F (37.2 C) (Oral)  Resp 20  SpO2 98% Physical Exam   Constitutional: She is oriented to person, place, and time. She appears well-developed and well-nourished. No distress.  HENT:  Head: Normocephalic and atraumatic.  Mouth/Throat: Mucous membranes are dry.  Eyes: Conjunctivae are normal. Pupils are equal, round, and reactive to light.  Cardiovascular: Normal rate, regular rhythm, normal heart sounds and intact distal pulses.  Exam reveals no gallop.   No murmur heard. Pulmonary/Chest: Effort normal and breath sounds normal. No respiratory distress. She has no wheezes.  Abdominal: Soft. She exhibits no distension and no mass. There is tenderness (mild diffuse tenderness, most prominent epigastrium). There is no rebound and no guarding.  Musculoskeletal: She exhibits no edema or tenderness.  Neurological: She is alert and oriented to person, place, and time. She exhibits normal muscle tone.  Skin: Skin is warm and dry.  Psychiatric: She has a normal mood and affect. Her behavior is normal.  Nursing note and vitals reviewed.   ED Course  Procedures (including critical care time) Labs Review Labs Reviewed  URINALYSIS, ROUTINE W REFLEX MICROSCOPIC  PREGNANCY, URINE  CBC WITH DIFFERENTIAL/PLATELET  BASIC METABOLIC PANEL   Neg upreg and UA okay (mod leukocytes and many bacteria, but many squams so likely contaminated specimen), CBC stable (low MCV). Electrolytes okay (slightly low K+ at 3.3).  Imaging Review No results found.   EKG Interpretation None      MDM   Final diagnoses:  Viral gastroenteritis   Improved after zofran, tolerated oral fluid challenge. Pt feels okay to go home. Stable for discharge, discharge teaching instructions with return precautions. Rx for zofran, continue oral hydration.    Leone Brand, MD 10/19/14 [place], MD 10/24/14 [phone]

## 2014-10-19 NOTE — ED Notes (Signed)
Pt c/o nausea vomiting and diarrhea since this morning. Stated that she has vomited x 4 and diarrhea x 3. 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

## 2014-10-19 NOTE — ED Notes (Addendum)
Pt given a soda to drink. 

## 2014-10-19 NOTE — ED Notes (Signed)
Pt. States she is still nauseated but is slightly better. Tolerating sips of water

## 2014-10-24 ENCOUNTER — Other Ambulatory Visit: Payer: Self-pay

## 2016-06-16 ENCOUNTER — Emergency Department (HOSPITAL_COMMUNITY)
Admission: EM | Admit: 2016-06-16 | Discharge: 2016-06-16 | Disposition: A | Discharge status: Home / Self Care | Payer: BLUE CROSS/BLUE SHIELD | Attending: Emergency Medicine | Admitting: Emergency Medicine

## 2016-06-16 ENCOUNTER — Encounter (HOSPITAL_COMMUNITY): Payer: Self-pay | Admitting: Emergency Medicine

## 2016-06-16 DIAGNOSIS — Y929 Unspecified place or not applicable: Secondary | ICD-10-CM | POA: Diagnosis not present

## 2016-06-16 DIAGNOSIS — Y93F2 Activity, caregiving, lifting: Secondary | ICD-10-CM | POA: Insufficient documentation

## 2016-06-16 DIAGNOSIS — Y999 Unspecified external cause status: Secondary | ICD-10-CM | POA: Diagnosis not present

## 2016-06-16 DIAGNOSIS — S39012A Strain of muscle, fascia and tendon of lower back, initial encounter: Secondary | ICD-10-CM

## 2016-06-16 DIAGNOSIS — I10 Essential (primary) hypertension: Secondary | ICD-10-CM | POA: Insufficient documentation

## 2016-06-16 DIAGNOSIS — X500XXA Overexertion from strenuous movement or load, initial encounter: Secondary | ICD-10-CM | POA: Insufficient documentation

## 2016-06-16 DIAGNOSIS — S3992XA Unspecified injury of lower back, initial encounter: Secondary | ICD-10-CM | POA: Diagnosis present

## 2016-06-16 MED ORDER — METHOCARBAMOL 500 MG PO TABS: 500.0000 mg | ORAL_TABLET | Freq: Two times a day (BID) | ORAL | 0 refills | Status: DC

## 2016-06-16 MED ORDER — IBUPROFEN 800 MG PO TABS: 800.0000 mg | ORAL_TABLET | Freq: Three times a day (TID) | ORAL | 0 refills | Status: DC

## 2016-06-16 MED ORDER — METHOCARBAMOL 500 MG PO TABS
1000.0000 mg | ORAL_TABLET | Freq: Once | ORAL | Status: AC
Start: 2016-06-16 — End: 2016-06-16
  Administered 2016-06-16: 1000 mg via ORAL
  Filled 2016-06-16: qty 2

## 2016-06-16 NOTE — ED Provider Notes (Signed)
[facility] DEPT Provider Note   CSN: [id number] Arrival date & time: 06/16/16  0745  History   Chief Complaint Chief Complaint  Patient presents with  . Back Pain    HPI Erika Eaton is a 40 y.o. female.  HPI  40 y.o. female presents to the Emergency Department today complaining of right lower back pain. 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 Noted on right side. Worse with ambulation. Attempted OTC remedies without relief. Notes pain 10/10. No loss of bowel or bladder function. No saddle anesthesia. Pt able to ambulate. No fevers. No N/V. No other symptoms noted.   Past Medical History:  Diagnosis Date  . Abnormal Pap smear   . Herpes simplex   . Hypertension     There are no active problems to display for this patient.   Past Surgical History:  Procedure Laterality Date  . EYE SURGERY    . TUBAL LIGATION    . TUBAL LIGATION  06/15/2011   Procedure: POST PARTUM TUBAL LIGATION;  Surgeon: Emeterio Reeve, MD;  Location: [facility];  Service: Gynecology;  Laterality: Bilateral;  bilateral post partum tubal ligation with filshie clips.    OB History    Gravida Para Term Preterm AB Living   4 4 2 2   4    SAB TAB Ectopic Multiple Live Births           1       Home Medications    Prior to Admission medications   Medication Sig Start Date End Date Taking? Authorizing Provider  ibuprofen (ADVIL,MOTRIN) 800 MG tablet Take 1 tablet (800 mg total) by mouth 3 (three) times daily. 06/16/16   Shary Decamp, PA-C  methocarbamol (ROBAXIN) 500 MG tablet Take 1 tablet (500 mg total) by mouth 2 (two) times daily. 06/16/16   Shary Decamp, PA-C  naproxen sodium (ALEVE) 220 MG tablet Take 220 mg by mouth 2 (two) times daily as needed (for pain).    Historical Provider, MD  ondansetron (ZOFRAN-ODT) 4 MG disintegrating tablet Take 1 tablet (4 mg total) by mouth every 8 (eight) hours as needed for nausea or vomiting. 10/19/14   Leone Brand, MD     Family History No family history on file.  Social History Social History  Substance Use Topics  . Smoking status: Never Smoker  . Smokeless tobacco: Not on file  . Alcohol use Yes     Comment: occasionally, not regularly      Allergies   Chocolate and Soda lime   Review of Systems Review of Systems  Constitutional: Negative for fever.  Gastrointestinal: Negative for nausea and vomiting.  Musculoskeletal: Positive for back pain.   Physical Exam Updated Vital Signs BP (!) 137/107 (BP Location: Left Arm)   Pulse 87   Temp 98.5 F (36.9 C) (Oral)   Resp 14   Ht 5\' 6"  (1.676 m)   Wt 86.2 kg   LMP 06/02/2016   SpO2 100%   BMI 30.67 kg/m   Physical Exam  Constitutional: She is oriented to person, place, and time. Vital signs are normal. She appears well-developed and well-nourished.  HENT:  Head: Normocephalic.  Right Ear: Hearing normal.  Left Ear: Hearing normal.  Eyes: Conjunctivae and EOM are normal. Pupils are equal, round, and reactive to light.  Neck: Normal range of motion. Neck supple.  Cardiovascular: Normal rate, regular rhythm, normal heart sounds and intact distal pulses.  Pulmonary/Chest: Effort normal and breath sounds normal.  Musculoskeletal:       Thoracic back: Normal.       Lumbar back: Normal. She exhibits normal range of motion, no tenderness, no bony tenderness and no pain.  TTP right lower back. No spinous process tenderness. No palpable or visible deformities.   Neurological: She is alert and oriented to person, place, and time. She has normal strength. No sensory deficit.  Skin: Skin is warm and dry.  Psychiatric: She has a normal mood and affect. Her speech is normal and behavior is normal. Thought content normal.  Nursing note and vitals reviewed.  ED Treatments / Results  Labs (all labs ordered are listed, but only abnormal results are displayed) Labs Reviewed - No data to display  EKG  EKG Interpretation None        Radiology No results found.  Procedures Procedures (including critical care time)  Medications Ordered in ED Medications  methocarbamol (ROBAXIN) tablet 1,000 mg (not administered)     Initial Impression / Assessment and Plan / ED Course  I have reviewed the triage vital signs and the nursing notes.  Pertinent labs & imaging results that were available during my care of the patient were reviewed by me and considered in my medical decision making (see chart for details).  Clinical Course   Final Clinical Impressions(s) / ED Diagnoses  I have reviewed the relevant previous healthcare records. I obtained HPI from historian.  ED Course:  Assessment: Patient is a 34yF who presents to the ED with back pain s/p lifting injury. 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 No neurological deficits appreciated. Patient is ambulatory. No warning symptoms of back pain including: fecal incontinence, urinary retention or overflow incontinence, night sweats, waking from sleep with back pain, unexplained fevers or weight loss, h/o cancer, IVDU, recent trauma. No concern for cauda equina, epidural abscess, or other serious cause of back pain. Likely lower back muscle strain. Conservative measures such as rest, ice/heat and pain medicine indicated with PCP follow-up if no improvement with conservative management.   Disposition/Plan:  DC Home Additional Verbal discharge instructions given and discussed with patient.  Pt Instructed to f/u with PCP in the next week for evaluation and treatment of symptoms. Return precautions given Pt acknowledges and agrees with plan  Supervising Physician Isla Pence, MD   Final diagnoses:  Low back strain, initial encounter    New Prescriptions New Prescriptions   IBUPROFEN (ADVIL,MOTRIN) 800 MG TABLET    Take 1 tablet (800 mg total) by mouth 3 (three) times daily.   METHOCARBAMOL (ROBAXIN) 500 MG TABLET    Take 1 tablet (500  mg total) by mouth 2 (two) times daily.     Shary Decamp, PA-C 06/16/16 [id number]    Isla Pence, MD 06/16/16 [phone]

## 2016-06-16 NOTE — ED Notes (Signed)
Declined W/C at D/C and was escorted to lobby by RN. 

## 2016-06-16 NOTE — Discharge Instructions (Signed)
Please read and follow all provided instructions.  Your diagnoses today include:  1. Low back strain, initial encounter    Tests performed today include: Vital signs - see below for your results today  Medications prescribed:   Take any prescribed medications only as directed.  Home care instructions:  Follow any educational materials contained in this packet Please rest, use ice or heat on your back for the next several days Do not lift, push, pull anything more than 10 pounds for the next week  Follow-up instructions: Please follow-up with your primary care provider in the next 1 week for further evaluation of your symptoms.   Return instructions:  SEEK IMMEDIATE MEDICAL ATTENTION IF YOU HAVE: New numbness, tingling, weakness, or problem with the use of your arms or legs Severe back pain not relieved with medications Loss control of your bowels or bladder Increasing pain in any areas of the body (such as chest or abdominal pain) Shortness of breath, dizziness, or fainting.  Worsening nausea (feeling sick to your stomach), vomiting, fever, or sweats Any other emergent concerns regarding your health   Additional Information:  Your vital signs today were: BP (!) 137/107 (BP Location: Left Arm)    Pulse 87    Temp 98.5 F (36.9 C) (Oral)    Resp 14    Ht 5\' 6"  (1.676 m)    Wt 86.2 kg    LMP 06/02/2016    SpO2 100%    BMI 30.67 kg/m  If your blood pressure (BP) was elevated above 135/85 this visit, please have this repeated by your doctor within one month. --------------

## 2016-10-09 ENCOUNTER — Encounter (HOSPITAL_COMMUNITY): Payer: Self-pay | Admitting: *Deleted

## 2016-10-09 ENCOUNTER — Emergency Department (HOSPITAL_COMMUNITY): Payer: BLUE CROSS/BLUE SHIELD

## 2016-10-09 ENCOUNTER — Emergency Department (HOSPITAL_COMMUNITY)
Admission: EM | Admit: 2016-10-09 | Discharge: 2016-10-09 | Disposition: A | Discharge status: Home / Self Care | Payer: BLUE CROSS/BLUE SHIELD | Attending: Emergency Medicine | Admitting: Emergency Medicine

## 2016-10-09 DIAGNOSIS — I10 Essential (primary) hypertension: Secondary | ICD-10-CM | POA: Insufficient documentation

## 2016-10-09 DIAGNOSIS — R05 Cough: Secondary | ICD-10-CM | POA: Diagnosis present

## 2016-10-09 DIAGNOSIS — J069 Acute upper respiratory infection, unspecified: Secondary | ICD-10-CM | POA: Diagnosis not present

## 2016-10-09 DIAGNOSIS — B9789 Other viral agents as the cause of diseases classified elsewhere: Secondary | ICD-10-CM

## 2016-10-09 MED ORDER — ACETAMINOPHEN 325 MG PO TABS
ORAL_TABLET | ORAL | Status: AC
  Filled 2016-10-09: qty 2

## 2016-10-09 MED ORDER — ACETAMINOPHEN 325 MG PO TABS
650.0000 mg | ORAL_TABLET | Freq: Once | ORAL | Status: AC | PRN
  Administered 2016-10-09: 650 mg via ORAL

## 2016-10-09 NOTE — ED Provider Notes (Signed)
[facility] DEPT Provider Note   CSN: [id number] Arrival date & time: 10/09/16  1616   By signing my name below, I, Soijett Blue, attest that this documentation has been prepared under the direction and in the presence of Forde Dandy, MD. Electronically Signed: Soijett Blue, ED Scribe. 10/09/16. 5:30 PM.  History   Chief Complaint Chief Complaint  Patient presents with  . Cough    HPI Erika Eaton is a 41 y.o. female with a PMHx of HTN, who presents to the Emergency Department complaining of cough onset earlier today. Pt notes that she works at [employer] where there have been sick contacts with similar symptoms. She is having associated symptoms of nasal congestion, chest wall tenderness due to cough, generalized body aches, fever, nausea, and ear pain. She has not tried any medications for the relief of her symptoms. She denies vomiting, diarrhea, dysuria, difficulty urinating, sore throat, and any other symptoms.     The history is provided by the patient. No language interpreter was used.    Past Medical History:  Diagnosis Date  . Abnormal Pap smear   . Herpes simplex   . Hypertension     There are no active problems to display for this patient.   Past Surgical History:  Procedure Laterality Date  . EYE SURGERY    . TUBAL LIGATION    . TUBAL LIGATION  06/15/2011   Procedure: POST PARTUM TUBAL LIGATION;  Surgeon: Emeterio Reeve, MD;  Location: [facility];  Service: Gynecology;  Laterality: Bilateral;  bilateral post partum tubal ligation with filshie clips.    OB History    Gravida Para Term Preterm AB Living   4 4 2 2   4    SAB TAB Ectopic Multiple Live Births           1       Home Medications    Prior to Admission medications   Medication Sig Start Date End Date Taking? Authorizing Provider  ibuprofen (ADVIL,MOTRIN) 800 MG tablet Take 1 tablet (800 mg total) by mouth 3 (three) times daily. 06/16/16   Shary Decamp, PA-C  methocarbamol (ROBAXIN)  500 MG tablet Take 1 tablet (500 mg total) by mouth 2 (two) times daily. 06/16/16   Shary Decamp, PA-C  naproxen sodium (ALEVE) 220 MG tablet Take 220 mg by mouth 2 (two) times daily as needed (for pain).    Historical Provider, MD  ondansetron (ZOFRAN-ODT) 4 MG disintegrating tablet Take 1 tablet (4 mg total) by mouth every 8 (eight) hours as needed for nausea or vomiting. 10/19/14   Leone Brand, MD    Family History No family history on file.  Social History Social History  Substance Use Topics  . Smoking status: Never Smoker  . Smokeless tobacco: Not on file  . Alcohol use Yes     Comment: occasionally, not regularly      Allergies   Chocolate and Soda lime   Review of Systems Review of Systems 10/14 systems reviewed and are negative other than those stated in the HPI  Physical Exam Updated Vital Signs BP (!) 166/108 (BP Location: Left Arm)   Pulse 106   Temp 101.5 F (38.6 C) (Oral)   Resp 18   Ht 5\' 6"  (1.676 m)   Wt 192 lb (87.1 kg)   LMP 10/09/2016   SpO2 98%   BMI 30.99 kg/m   Physical Exam Physical Exam  Nursing note and vitals reviewed. Constitutional: Well developed, well nourished, non-toxic, and  in no acute distress Head: Normocephalic and atraumatic.  Mouth/Throat: Oropharynx is clear and moist.  Neck: Normal range of motion. Neck supple.  Cardiovascular: Normal rate and regular rhythm.   Pulmonary/Chest: Effort normal and breath sounds normal.  Abdominal: Soft. There is no tenderness. There is no rebound and no guarding.  Musculoskeletal: Normal range of motion.  Neurological: Alert, no facial droop, fluent speech, moves all extremities symmetrically Skin: Skin is warm and dry.  Psychiatric: Cooperative   ED Treatments / Results  DIAGNOSTIC STUDIES: Oxygen Saturation is 98% on RA, nl by my interpretation.    COORDINATION OF CARE: 5:30 PM Discussed treatment plan with pt at bedside which includes tylenol, CXR, labs, and pt agreed to  plan.   Labs (all labs ordered are listed, but only abnormal results are displayed) Labs Reviewed  PREGNANCY, URINE    Radiology Dg Chest 2 View  Result Date: 10/09/2016 CLINICAL DATA:  Central chest pain and dyspnea EXAM: CHEST  2 VIEW COMPARISON:  None. FINDINGS: The heart size and mediastinal contours are within normal limits. Both lungs are clear. The visualized skeletal structures are unremarkable. IMPRESSION: No active cardiopulmonary disease. Electronically Signed   By: Ashley Royalty M.D.   On: 10/09/2016 18:15    Procedures Procedures (including critical care time)  Medications Ordered in ED Medications  acetaminophen (TYLENOL) 325 MG tablet (not administered)  acetaminophen (TYLENOL) tablet 650 mg (650 mg Oral Given 10/09/16 1654)     Initial Impression / Assessment and Plan / ED Course  I have reviewed the triage vital signs and the nursing notes.  Pertinent labs & imaging results that were available during my care of the patient were reviewed by me and considered in my medical decision making (see chart for details).     Presenting with fever, cough and congestion times one day. Is nontoxic in no acute distress. Does have fever of 101.5 here in the ED and given Tylenol. Chest x-ray visualized shows no acute cardiopulmonary processes, Including pneumonia. Exam otherwise reassuring.Presentation consistent with viral illness, possible influenza. Discuss continued supportive care management for home. Strict return and follow-up instructions reviewed. She expressed understanding of all discharge instructions and felt comfortable with the plan of care.   Final Clinical Impressions(s) / ED Diagnoses   Final diagnoses:  Viral URI with cough    New Prescriptions New Prescriptions   No medications on file   I personally performed the services described in this documentation, which was scribed in my presence. The recorded information has been reviewed and is accurate.     Forde Dandy, MD 10/09/16 [phone]

## 2016-10-09 NOTE — Discharge Instructions (Signed)
Take tylenol and ibuprofen for fever, aches, and pains. Drink plenty of fluids and get plenty of rest.   Your chest xray does not show pneumonia. This is a viral infection.  Return without fail for worsening symptoms, including fever > 6-7 days, confusion, intractable vomiting, difficulty breathing, passing out, or any other symptoms concerning to you.

## 2016-10-09 NOTE — ED Triage Notes (Signed)
Pt reports cough, congestion, nausea and headache. Pt reports fevers and bodyaches.

## 2018-05-30 LAB — GLUCOSE, POCT (MANUAL RESULT ENTRY): POC GLUCOSE: 97 mg/dL (ref 70–99)

## 2019-07-14 ENCOUNTER — Other Ambulatory Visit: Payer: Self-pay | Admitting: Obstetrics and Gynecology

## 2019-07-15 ENCOUNTER — Other Ambulatory Visit: Payer: Self-pay | Admitting: Obstetrics and Gynecology

## 2019-07-22 ENCOUNTER — Other Ambulatory Visit: Payer: Self-pay

## 2019-07-22 ENCOUNTER — Ambulatory Visit (HOSPITAL_COMMUNITY)
Admission: RE | Admit: 2019-07-22 | Discharge: 2019-07-22 | Disposition: A | Discharge status: Home / Self Care | Payer: No Typology Code available for payment source | Source: Ambulatory Visit | Attending: Internal Medicine | Admitting: Internal Medicine

## 2019-07-22 DIAGNOSIS — D509 Iron deficiency anemia, unspecified: Secondary | ICD-10-CM | POA: Insufficient documentation

## 2019-07-22 MED ORDER — SODIUM CHLORIDE 0.9 % IV SOLN
750.0000 mg | Freq: Once | INTRAVENOUS | Status: AC
  Administered 2019-07-22: 750 mg via INTRAVENOUS
  Filled 2019-07-22: qty 15

## 2019-07-22 MED ORDER — SODIUM CHLORIDE 0.9 % IV SOLN
INTRAVENOUS | Status: DC | PRN
  Administered 2019-07-22: 250 mL via INTRAVENOUS

## 2019-07-22 NOTE — Progress Notes (Signed)
PATIENT CARE CENTER NOTE  Diagnosis: Iron Deficiency Anemia    Provider: Servando Salina, MD   Procedure: Christeen Douglas IV   Note: Patient received Injectafer infusion via PIV. Tolerated well with no adverse reaction. Observed patient for 30 minutes post-infusion. Vital signs remained stable. Discharge instructions given. Patient alert, oriented and ambulatory at discharge.

## 2019-07-22 NOTE — Discharge Instructions (Signed)

## 2019-07-26 ENCOUNTER — Other Ambulatory Visit: Payer: Self-pay | Admitting: Obstetrics and Gynecology

## 2019-07-27 ENCOUNTER — Other Ambulatory Visit: Payer: Self-pay | Admitting: Obstetrics and Gynecology

## 2019-08-04 ENCOUNTER — Other Ambulatory Visit: Payer: Self-pay

## 2019-08-04 ENCOUNTER — Ambulatory Visit (HOSPITAL_COMMUNITY)
Admission: RE | Admit: 2019-08-04 | Discharge: 2019-08-04 | Disposition: A | Discharge status: Home / Self Care | Payer: No Typology Code available for payment source | Source: Ambulatory Visit | Attending: Internal Medicine | Admitting: Internal Medicine

## 2019-08-04 DIAGNOSIS — D509 Iron deficiency anemia, unspecified: Secondary | ICD-10-CM | POA: Diagnosis not present

## 2019-08-04 MED ORDER — SODIUM CHLORIDE 0.9 % IV SOLN
750.0000 mg | Freq: Once | INTRAVENOUS | Status: AC
  Administered 2019-08-04: 750 mg via INTRAVENOUS
  Filled 2019-08-04: qty 15

## 2019-08-04 MED ORDER — SODIUM CHLORIDE 0.9 % IV SOLN
INTRAVENOUS | Status: DC | PRN
  Administered 2019-08-04: 09:00:00 via INTRAVENOUS

## 2019-08-04 NOTE — Discharge Instructions (Signed)

## 2019-08-04 NOTE — Progress Notes (Signed)
Diagnosis: Iron Deficiency Anemia    Provider: Servando Salina, MD   Procedure: Christeen Douglas IV   Note: Patient received Injectafer infusion and tolerated well with no adverse reaction. Observed patient for 30 minutes post-infusion. Vital signs remained stable. Discharge instructions given. Patient alert, oriented and ambulatory at discharge.

## 2019-10-14 ENCOUNTER — Other Ambulatory Visit: Payer: Self-pay | Admitting: Obstetrics and Gynecology

## 2019-10-14 ENCOUNTER — Encounter (HOSPITAL_COMMUNITY): Payer: Self-pay

## 2019-10-14 NOTE — Progress Notes (Signed)
Spoke with Shanelle to request orders from Dr. Garwin Brothers.

## 2019-10-14 NOTE — Patient Instructions (Addendum)
DUE TO COVID-19 ONLY ONE VISITOR IS ALLOWED IN WAITING ROOM (VISITOR WILL HAVE A TEMPERATURE CHECK ON ARRIVAL AND MUST WEAR A FACE MASK THE ENTIRE TIME.)  ONCE YOU ARE ADMITTED TO YOUR PRIVATE ROOM, THE SAME ONE VISITOR IS ALLOWED TO VISIT DURING VISITING HOURS ONLY.  Your COVID swab testing is scheduled for Monday, Feb. 1, 2021 at 3:00PM , You must self quarantine after your testing per handout given to you at the testing site.  ([facility] testing enter pre-surgical testing line)   Your procedure is scheduled on: Thursday, Feb. 4, 2021  Report to [facility] AT 11:00  A. M.   Call this number if you have problems the morning of surgery:  [phone].   OUR ADDRESS IS [address].  WE ARE LOCATED IN THE [place]                                   [facility].                                     REMEMBER:  DO NOT EAT FOOD AFTER MIDNIGHT .    MAY HAVE LIQUID 7:00 AM DAY OF SURGERY  CLEAR LIQUID DIET  Foods Allowed                                                                     Foods Excluded  Water, Black Coffee and tea, regular and decaf                             liquids that you cannot  Plain Jell-O in any flavor  (No red)                                           see through such as: Fruit ices (not with fruit pulp)                                     milk, soups, orange juice  Iced Popsicles (No red)                                    All solid food Carbonated beverages, regular and diet                                    Apple juices Sports drinks like Gatorade (No red) Lightly seasoned clear broth or consume(fat free) Sugar, honey syrup  Sample Menu Breakfast                                Lunch  Supper Cranberry juice                    Beef broth                            Chicken broth Jell-O                                     Grape juice                            Apple juice Coffee or tea                        Jell-O                                      Popsicle                                                Coffee or tea                        Coffee or tea  BRUSH YOUR TEETH THE MORNING OF SURGERY.  TAKE THESE MEDICATIONS MORNING OF SURGERY WITH A SIP OF WATER:  None  DO NOT WEAR JEWERLY, MAKE UP, OR NAIL POLISH.  DO NOT WEAR LOTIONS, POWDERS, PERFUMES/COLOGNE OR DEODORANT.  DO NOT SHAVE FOR 24 HOURS PRIOR TO DAY OF SURGERY.  CONTACTS, GLASSES, OR DENTURES MAY NOT BE WORN TO SURGERY.                                    [facility] IS NOT RESPONSIBLE  FOR ANY BELONGINGS.          BRING ALL PRESCRIPTION MEDICATIONS WITH YOU THE DAY OF SURGERY IN ORIGINAL CONTAINERS                                                               [facility] - Preparing for Surgery Before surgery, you can play an important role.  Because skin is not sterile, your skin needs to be as free of germs as possible.  You can reduce the number of germs on your skin by washing with CHG (chlorahexidine gluconate) soap before surgery.  CHG is an antiseptic cleaner which kills germs and bonds with the skin to continue killing germs even after washing. Please DO NOT use if you have an allergy to CHG or antibacterial soaps.  If your skin becomes reddened/irritated stop using the CHG and inform your nurse when you arrive at Short Stay. Do not shave (including legs and underarms) for at least 48 hours prior to the first CHG shower.  You may shave your face/neck.  Please follow these instructions carefully:  1.  Shower with CHG Soap the night before  surgery and the  morning of surgery.  2.  If you choose to wash your hair, wash your hair first as usual with your normal  shampoo.  3.  After you shampoo, rinse your hair and body thoroughly to remove the shampoo.                             4.  Use CHG as you would any other liquid soap.  You can apply chg directly to the  skin and wash.  Gently with a scrungie or clean washcloth.  5.  Apply the CHG Soap to your body ONLY FROM THE NECK DOWN.   Do   not use on face/ open                           Wound or open sores. Avoid contact with eyes, ears mouth and   genitals (private parts).                       Wash face,  Genitals (private parts) with your normal soap.             6.  Wash thoroughly, paying special attention to the area where your    surgery  will be performed.  7.  Thoroughly rinse your body with warm water from the neck down.  8.  DO NOT shower/wash with your normal soap after using and rinsing off the CHG Soap.                9.  Pat yourself dry with a clean towel.            10.  Wear clean pajamas.            11.  Place clean sheets on your bed the night of your first shower and do not  sleep with pets. Day of Surgery : Do not apply any lotions/deodorants the morning of surgery.  Please wear clean clothes to the hospital/surgery center.  FAILURE TO FOLLOW THESE INSTRUCTIONS MAY RESULT IN THE CANCELLATION OF YOUR SURGERY  PATIENT SIGNATURE_________________________________  NURSE SIGNATURE__________________________________  ________________________________________________________________________

## 2019-10-18 ENCOUNTER — Inpatient Hospital Stay (HOSPITAL_COMMUNITY): Admission: RE | Admit: 2019-10-18 | Payer: No Typology Code available for payment source | Source: Ambulatory Visit

## 2019-10-18 ENCOUNTER — Other Ambulatory Visit: Payer: Self-pay

## 2019-10-18 ENCOUNTER — Encounter (HOSPITAL_COMMUNITY)
Admission: RE | Admit: 2019-10-18 | Discharge: 2019-10-18 | Disposition: A | Discharge status: Home / Self Care | Payer: No Typology Code available for payment source | Source: Ambulatory Visit | Attending: Obstetrics and Gynecology | Admitting: Obstetrics and Gynecology

## 2019-10-18 ENCOUNTER — Encounter (HOSPITAL_COMMUNITY): Payer: Self-pay

## 2019-10-18 DIAGNOSIS — Z01818 Encounter for other preprocedural examination: Secondary | ICD-10-CM | POA: Diagnosis not present

## 2019-10-18 DIAGNOSIS — I4510 Unspecified right bundle-branch block: Secondary | ICD-10-CM | POA: Diagnosis not present

## 2019-10-18 HISTORY — DX: Personal history of other complications of pregnancy, childbirth and the puerperium: Z87.59

## 2019-10-18 HISTORY — DX: Iron deficiency anemia, unspecified: D50.9

## 2019-10-18 LAB — BASIC METABOLIC PANEL
Anion gap: 7 (ref 5–15)
BUN: 12 mg/dL (ref 6–20)
CO2: 26 mmol/L (ref 22–32)
Calcium: 8.4 mg/dL — ABNORMAL LOW (ref 8.9–10.3)
Chloride: 104 mmol/L (ref 98–111)
Creatinine, Ser: 0.77 mg/dL (ref 0.44–1.00)
GFR calc Af Amer: >60 mL/min (ref >60)
GFR calc non Af Amer: >60 mL/min (ref >60)
Glucose, Bld: 94 mg/dL (ref 70–99)
Potassium: 3.9 mmol/L (ref 3.5–5.1)
Sodium: 137 mmol/L (ref 135–145)

## 2019-10-18 LAB — CBC
HCT: 39.9 % (ref 36.0–46.0)
Hemoglobin: 12.6 g/dL (ref 12.0–15.0)
MCH: 25.5 pg — ABNORMAL LOW (ref 26.0–34.0)
MCHC: 31.6 g/dL (ref 30.0–36.0)
MCV: 80.6 fL (ref 80.0–100.0)
Platelets: 178 10*3/uL (ref 150–400)
RBC: 4.95 MIL/uL (ref 3.87–5.11)
RDW: 17.8 % — ABNORMAL HIGH (ref 11.5–15.5)
WBC: 5.1 10*3/uL (ref 4.0–10.5)
nRBC: 0 % (ref 0.0–0.2)

## 2019-10-18 LAB — ABO/RH: ABO/RH(D): O POS

## 2019-10-18 NOTE — Progress Notes (Signed)
PCP - N/A Cardiologist -  N/A  Chest x-ray - Greater than 1 year EKG - 10/18/19 in epic Stress Test - N/A ECHO - N/A Cardiac Cath - N/A  Sleep Study - N/A CPAP - N/A  Fasting Blood Sugar - N/A Checks Blood Sugar _N/A____ times a day  Blood Thinner Instructions:  N/A Aspirin Instructions:  N/A Last Dose:  N/A  Anesthesia review: N/A  Patient denies shortness of breath, fever, cough and chest pain at PAT appointment   Patient verbalized understanding of instructions that were given to them at the PAT appointment. Patient was also instructed that they will need to review over the PAT instructions again at home before surgery.

## 2019-10-19 ENCOUNTER — Ambulatory Visit: Payer: Self-pay | Admitting: Family Medicine

## 2019-10-19 ENCOUNTER — Other Ambulatory Visit (HOSPITAL_COMMUNITY)
Admission: RE | Admit: 2019-10-19 | Discharge: 2019-10-19 | Disposition: A | Discharge status: Home / Self Care | Payer: No Typology Code available for payment source | Source: Ambulatory Visit | Attending: Obstetrics and Gynecology | Admitting: Obstetrics and Gynecology

## 2019-10-19 DIAGNOSIS — Z01812 Encounter for preprocedural laboratory examination: Secondary | ICD-10-CM | POA: Insufficient documentation

## 2019-10-19 DIAGNOSIS — Z20822 Contact with and (suspected) exposure to covid-19: Secondary | ICD-10-CM | POA: Diagnosis not present

## 2019-10-19 LAB — SARS CORONAVIRUS 2 (TAT 6-24 HRS): SARS Coronavirus 2: NEGATIVE

## 2019-10-21 ENCOUNTER — Ambulatory Visit (HOSPITAL_BASED_OUTPATIENT_CLINIC_OR_DEPARTMENT_OTHER)
Admission: RE | Admit: 2019-10-21 | Discharge: 2019-10-22 | Disposition: A | Discharge status: Home / Self Care | Payer: No Typology Code available for payment source | Attending: Obstetrics and Gynecology | Admitting: Obstetrics and Gynecology

## 2019-10-21 ENCOUNTER — Ambulatory Visit (HOSPITAL_BASED_OUTPATIENT_CLINIC_OR_DEPARTMENT_OTHER): Payer: No Typology Code available for payment source | Admitting: Physician Assistant

## 2019-10-21 ENCOUNTER — Encounter (HOSPITAL_BASED_OUTPATIENT_CLINIC_OR_DEPARTMENT_OTHER): Payer: Self-pay | Admitting: Obstetrics and Gynecology

## 2019-10-21 ENCOUNTER — Ambulatory Visit (HOSPITAL_BASED_OUTPATIENT_CLINIC_OR_DEPARTMENT_OTHER): Payer: No Typology Code available for payment source | Admitting: Anesthesiology

## 2019-10-21 ENCOUNTER — Encounter (HOSPITAL_BASED_OUTPATIENT_CLINIC_OR_DEPARTMENT_OTHER)
Admission: RE | Disposition: A | Discharge status: Home / Self Care | Payer: Self-pay | Source: Home / Self Care | Attending: Obstetrics and Gynecology

## 2019-10-21 DIAGNOSIS — N800 Endometriosis of uterus: Secondary | ICD-10-CM | POA: Diagnosis not present

## 2019-10-21 DIAGNOSIS — E669 Obesity, unspecified: Secondary | ICD-10-CM | POA: Diagnosis not present

## 2019-10-21 DIAGNOSIS — D259 Leiomyoma of uterus, unspecified: Secondary | ICD-10-CM | POA: Insufficient documentation

## 2019-10-21 DIAGNOSIS — Z6831 Body mass index (BMI) 31.0-31.9, adult: Secondary | ICD-10-CM | POA: Insufficient documentation

## 2019-10-21 DIAGNOSIS — Z90710 Acquired absence of both cervix and uterus: Secondary | ICD-10-CM | POA: Diagnosis present

## 2019-10-21 DIAGNOSIS — N871 Moderate cervical dysplasia: Secondary | ICD-10-CM | POA: Insufficient documentation

## 2019-10-21 HISTORY — DX: Acquired absence of both cervix and uterus: Z90.710

## 2019-10-21 HISTORY — PX: ROBOTIC ASSISTED LAPAROSCOPIC HYSTERECTOMY AND SALPINGECTOMY: SHX6379

## 2019-10-21 HISTORY — DX: Leiomyoma of uterus, unspecified: D25.9

## 2019-10-21 LAB — TYPE AND SCREEN
ABO/RH(D): O POS
Antibody Screen: NEGATIVE

## 2019-10-21 LAB — POCT PREGNANCY, URINE: Preg Test, Ur: NEGATIVE

## 2019-10-21 SURGERY — XI ROBOTIC ASSISTED LAPAROSCOPIC HYSTERECTOMY AND SALPINGECTOMY
Anesthesia: General | Site: Abdomen | Laterality: Bilateral

## 2019-10-21 MED ORDER — SIMETHICONE 80 MG PO CHEW
80.0000 mg | CHEWABLE_TABLET | Freq: Four times a day (QID) | ORAL | Status: DC | PRN
  Filled 2019-10-21: qty 1

## 2019-10-21 MED ORDER — ONDANSETRON HCL 4 MG/2ML IJ SOLN
4.0000 mg | Freq: Four times a day (QID) | INTRAMUSCULAR | Status: DC | PRN
  Administered 2019-10-21: 4 mg via INTRAVENOUS
  Filled 2019-10-21: qty 2

## 2019-10-21 MED ORDER — DEXMEDETOMIDINE HCL IN NACL 200 MCG/50ML IV SOLN
INTRAVENOUS | Status: AC
  Filled 2019-10-21: qty 50

## 2019-10-21 MED ORDER — CEFAZOLIN SODIUM-DEXTROSE 2-4 GM/100ML-% IV SOLN
2.0000 g | INTRAVENOUS | Status: AC
  Administered 2019-10-21 (×2): 2 g via INTRAVENOUS
  Filled 2019-10-21: qty 100

## 2019-10-21 MED ORDER — FENTANYL CITRATE (PF) 100 MCG/2ML IJ SOLN
INTRAMUSCULAR | Status: DC | PRN
  Administered 2019-10-21 (×6): 50 ug via INTRAVENOUS

## 2019-10-21 MED ORDER — SODIUM CHLORIDE 0.9 % IR SOLN
Status: DC | PRN
  Administered 2019-10-21: 3000 mL

## 2019-10-21 MED ORDER — LIDOCAINE 2% (20 MG/ML) 5 ML SYRINGE
INTRAMUSCULAR | Status: DC | PRN
  Administered 2019-10-21: 1.5 mg/kg/h via INTRAVENOUS

## 2019-10-21 MED ORDER — MIDAZOLAM HCL 2 MG/2ML IJ SOLN
INTRAMUSCULAR | Status: DC | PRN
  Administered 2019-10-21: 2 mg via INTRAVENOUS

## 2019-10-21 MED ORDER — SCOPOLAMINE 1 MG/3DAYS TD PT72
1.0000 | MEDICATED_PATCH | TRANSDERMAL | Status: DC
  Administered 2019-10-21: 1.5 mg via TRANSDERMAL
  Filled 2019-10-21: qty 1

## 2019-10-21 MED ORDER — ONDANSETRON HCL 4 MG/2ML IJ SOLN
INTRAMUSCULAR | Status: DC | PRN
  Administered 2019-10-21: 4 mg via INTRAVENOUS

## 2019-10-21 MED ORDER — ONDANSETRON HCL 4 MG/2ML IJ SOLN
INTRAMUSCULAR | Status: AC
  Filled 2019-10-21: qty 2

## 2019-10-21 MED ORDER — OXYCODONE HCL 5 MG PO TABS
ORAL_TABLET | ORAL | Status: AC
  Filled 2019-10-21: qty 1

## 2019-10-21 MED ORDER — PANTOPRAZOLE SODIUM 40 MG PO TBEC
40.0000 mg | DELAYED_RELEASE_TABLET | Freq: Every day | ORAL | Status: DC
  Administered 2019-10-21: 40 mg via ORAL
  Filled 2019-10-21: qty 1

## 2019-10-21 MED ORDER — FENTANYL CITRATE (PF) 100 MCG/2ML IJ SOLN
INTRAMUSCULAR | Status: AC
  Filled 2019-10-21: qty 4

## 2019-10-21 MED ORDER — KETOROLAC TROMETHAMINE 30 MG/ML IJ SOLN
INTRAMUSCULAR | Status: AC
  Filled 2019-10-21: qty 1

## 2019-10-21 MED ORDER — ACETAMINOPHEN 500 MG PO TABS
ORAL_TABLET | ORAL | Status: AC
  Filled 2019-10-21: qty 2

## 2019-10-21 MED ORDER — ACETAMINOPHEN 500 MG PO TABS
1000.0000 mg | ORAL_TABLET | Freq: Once | ORAL | Status: AC
  Administered 2019-10-21: 1000 mg via ORAL
  Filled 2019-10-21: qty 2

## 2019-10-21 MED ORDER — PROPOFOL 10 MG/ML IV BOLUS
INTRAVENOUS | Status: DC | PRN
  Administered 2019-10-21: 200 mg via INTRAVENOUS

## 2019-10-21 MED ORDER — HYDROMORPHONE HCL 1 MG/ML IJ SOLN
0.2500 mg | INTRAMUSCULAR | Status: DC | PRN
  Administered 2019-10-21 (×2): 0.5 mg via INTRAVENOUS
  Filled 2019-10-21: qty 0.5

## 2019-10-21 MED ORDER — MEPERIDINE HCL 25 MG/ML IJ SOLN
6.2500 mg | INTRAMUSCULAR | Status: DC | PRN
  Filled 2019-10-21: qty 1

## 2019-10-21 MED ORDER — LIDOCAINE 2% (20 MG/ML) 5 ML SYRINGE
INTRAMUSCULAR | Status: DC | PRN
  Administered 2019-10-21: 100 mg via INTRAVENOUS

## 2019-10-21 MED ORDER — DEXAMETHASONE SODIUM PHOSPHATE 10 MG/ML IJ SOLN
INTRAMUSCULAR | Status: AC
  Filled 2019-10-21: qty 1

## 2019-10-21 MED ORDER — KETOROLAC TROMETHAMINE 30 MG/ML IJ SOLN
30.0000 mg | Freq: Four times a day (QID) | INTRAMUSCULAR | Status: DC
  Administered 2019-10-21 – 2019-10-22 (×2): 30 mg via INTRAVENOUS
  Filled 2019-10-21: qty 1

## 2019-10-21 MED ORDER — KETOROLAC TROMETHAMINE 30 MG/ML IJ SOLN
INTRAMUSCULAR | Status: DC | PRN
  Administered 2019-10-21: 30 mg via INTRAVENOUS

## 2019-10-21 MED ORDER — KETOROLAC TROMETHAMINE 30 MG/ML IJ SOLN
30.0000 mg | Freq: Once | INTRAMUSCULAR | Status: DC | PRN
  Filled 2019-10-21: qty 1

## 2019-10-21 MED ORDER — PANTOPRAZOLE SODIUM 40 MG PO TBEC
DELAYED_RELEASE_TABLET | ORAL | Status: AC
  Filled 2019-10-21: qty 1

## 2019-10-21 MED ORDER — LIDOCAINE 2% (20 MG/ML) 5 ML SYRINGE
INTRAMUSCULAR | Status: AC
  Filled 2019-10-21: qty 5

## 2019-10-21 MED ORDER — KETAMINE HCL 10 MG/ML IJ SOLN
INTRAMUSCULAR | Status: DC | PRN
  Administered 2019-10-21: 30 mg via INTRAVENOUS

## 2019-10-21 MED ORDER — KETAMINE HCL 10 MG/ML IJ SOLN
INTRAMUSCULAR | Status: AC
  Filled 2019-10-21: qty 1

## 2019-10-21 MED ORDER — LABETALOL HCL 5 MG/ML IV SOLN
INTRAVENOUS | Status: AC
  Filled 2019-10-21: qty 4

## 2019-10-21 MED ORDER — WHITE PETROLATUM EX OINT
TOPICAL_OINTMENT | CUTANEOUS | Status: AC
  Filled 2019-10-21: qty 5

## 2019-10-21 MED ORDER — LABETALOL HCL 5 MG/ML IV SOLN
INTRAVENOUS | Status: DC | PRN
  Administered 2019-10-21 (×3): 5 mg via INTRAVENOUS

## 2019-10-21 MED ORDER — DEXTROSE IN LACTATED RINGERS 5 % IV SOLN
INTRAVENOUS | Status: DC
  Filled 2019-10-21 (×2): qty 1000

## 2019-10-21 MED ORDER — PROPOFOL 10 MG/ML IV BOLUS
INTRAVENOUS | Status: AC
  Filled 2019-10-21: qty 20

## 2019-10-21 MED ORDER — ROCURONIUM BROMIDE 10 MG/ML (PF) SYRINGE
PREFILLED_SYRINGE | INTRAVENOUS | Status: DC | PRN
  Administered 2019-10-21: 140 mg via INTRAVENOUS
  Administered 2019-10-21 (×2): 10 mg via INTRAVENOUS

## 2019-10-21 MED ORDER — FENTANYL CITRATE (PF) 100 MCG/2ML IJ SOLN
INTRAMUSCULAR | Status: AC
  Filled 2019-10-21: qty 2

## 2019-10-21 MED ORDER — SUGAMMADEX SODIUM 200 MG/2ML IV SOLN
INTRAVENOUS | Status: DC | PRN
  Administered 2019-10-21: 180 mg via INTRAVENOUS

## 2019-10-21 MED ORDER — IBUPROFEN 800 MG PO TABS
800.0000 mg | ORAL_TABLET | Freq: Four times a day (QID) | ORAL | Status: DC
  Filled 2019-10-21: qty 1

## 2019-10-21 MED ORDER — LACTATED RINGERS IV SOLN
INTRAVENOUS | Status: DC
  Filled 2019-10-21: qty 1000

## 2019-10-21 MED ORDER — HYDROMORPHONE HCL 1 MG/ML IJ SOLN
INTRAMUSCULAR | Status: AC
  Filled 2019-10-21: qty 1

## 2019-10-21 MED ORDER — DEXAMETHASONE SODIUM PHOSPHATE 10 MG/ML IJ SOLN
INTRAMUSCULAR | Status: DC | PRN
  Administered 2019-10-21: 5 mg via INTRAVENOUS

## 2019-10-21 MED ORDER — ONDANSETRON HCL 4 MG PO TABS
4.0000 mg | ORAL_TABLET | Freq: Four times a day (QID) | ORAL | Status: DC | PRN
  Filled 2019-10-21: qty 1

## 2019-10-21 MED ORDER — PROMETHAZINE HCL 25 MG/ML IJ SOLN
6.2500 mg | INTRAMUSCULAR | Status: DC | PRN
  Filled 2019-10-21: qty 1

## 2019-10-21 MED ORDER — MIDAZOLAM HCL 2 MG/2ML IJ SOLN
INTRAMUSCULAR | Status: AC
  Filled 2019-10-21: qty 2

## 2019-10-21 MED ORDER — CEFAZOLIN SODIUM-DEXTROSE 2-4 GM/100ML-% IV SOLN
INTRAVENOUS | Status: AC
  Filled 2019-10-21: qty 100

## 2019-10-21 MED ORDER — OXYCODONE HCL 5 MG PO TABS
5.0000 mg | ORAL_TABLET | ORAL | Status: DC | PRN
  Administered 2019-10-21 (×2): 5 mg via ORAL
  Administered 2019-10-22 (×2): 10 mg via ORAL
  Filled 2019-10-21: qty 2

## 2019-10-21 MED ORDER — ROCURONIUM BROMIDE 10 MG/ML (PF) SYRINGE
PREFILLED_SYRINGE | INTRAVENOUS | Status: AC
  Filled 2019-10-21: qty 10

## 2019-10-21 MED ORDER — MENTHOL 3 MG MT LOZG
1.0000 | LOZENGE | OROMUCOSAL | Status: DC | PRN
  Administered 2019-10-22: 3 mg via ORAL
  Filled 2019-10-21: qty 9

## 2019-10-21 MED ORDER — LABETALOL HCL 5 MG/ML IV SOLN
5.0000 mg | INTRAVENOUS | Status: DC | PRN
  Administered 2019-10-21 (×2): 5 mg via INTRAVENOUS
  Filled 2019-10-21: qty 4

## 2019-10-21 MED ORDER — CEFAZOLIN SODIUM 1 G IJ SOLR
INTRAMUSCULAR | Status: AC
  Filled 2019-10-21: qty 20

## 2019-10-21 MED ORDER — SCOPOLAMINE 1 MG/3DAYS TD PT72
MEDICATED_PATCH | TRANSDERMAL | Status: AC
  Filled 2019-10-21: qty 1

## 2019-10-21 MED ORDER — BUPIVACAINE HCL (PF) 0.5 % IJ SOLN
INTRAMUSCULAR | Status: DC | PRN
  Administered 2019-10-21: 10 mL

## 2019-10-21 SURGICAL SUPPLY — 56 items
APPLICATOR ARISTA FLEXITIP XL (MISCELLANEOUS) ×2 IMPLANT
BARRIER ADHS 3X4 INTERCEED (GAUZE/BANDAGES/DRESSINGS) IMPLANT
CANISTER SUCT 3000ML PPV (MISCELLANEOUS) ×2 IMPLANT
CATH FOLEY 3WAY  5CC 16FR (CATHETERS) ×1
CATH FOLEY 3WAY 5CC 16FR (CATHETERS) ×1 IMPLANT
COVER BACK TABLE 60X90IN (DRAPES) ×2 IMPLANT
COVER TIP SHEARS 8 DVNC (MISCELLANEOUS) ×1 IMPLANT
COVER TIP SHEARS 8MM DA VINCI (MISCELLANEOUS) ×1
DECANTER SPIKE VIAL GLASS SM (MISCELLANEOUS) ×2 IMPLANT
DEFOGGER SCOPE WARMER CLEARIFY (MISCELLANEOUS) ×2 IMPLANT
DERMABOND ADVANCED (GAUZE/BANDAGES/DRESSINGS) ×1
DERMABOND ADVANCED .7 DNX12 (GAUZE/BANDAGES/DRESSINGS) ×1 IMPLANT
DRAPE ARM DVNC X/XI (DISPOSABLE) ×4 IMPLANT
DRAPE COLUMN DVNC XI (DISPOSABLE) ×1 IMPLANT
DRAPE DA VINCI XI ARM (DISPOSABLE) ×4
DRAPE DA VINCI XI COLUMN (DISPOSABLE) ×1
DURAPREP 26ML APPLICATOR (WOUND CARE) ×2 IMPLANT
ELECT REM PT RETURN 9FT ADLT (ELECTROSURGICAL) ×2
ELECTRODE REM PT RTRN 9FT ADLT (ELECTROSURGICAL) ×1 IMPLANT
GLOVE BIOGEL PI IND STRL 7.0 (GLOVE) ×5 IMPLANT
GLOVE BIOGEL PI INDICATOR 7.0 (GLOVE) ×5
GLOVE ECLIPSE 6.5 STRL STRAW (GLOVE) ×6 IMPLANT
HEMOSTAT ARISTA ABSORB 3G PWDR (HEMOSTASIS) ×2 IMPLANT
IRRIG SUCT STRYKERFLOW 2 WTIP (MISCELLANEOUS) ×2
IRRIGATION SUCT STRKRFLW 2 WTP (MISCELLANEOUS) ×1 IMPLANT
LEGGING LITHOTOMY PAIR STRL (DRAPES) ×2 IMPLANT
NEEDLE INSUFFLATION 120MM (ENDOMECHANICALS) ×2 IMPLANT
OBTURATOR OPTICAL STANDARD 8MM (TROCAR) ×1
OBTURATOR OPTICAL STND 8 DVNC (TROCAR) ×1
OBTURATOR OPTICALSTD 8 DVNC (TROCAR) ×1 IMPLANT
OCCLUDER COLPOPNEUMO (BALLOONS) ×2 IMPLANT
PACK ROBOT WH (CUSTOM PROCEDURE TRAY) ×2 IMPLANT
PACK ROBOTIC GOWN (GOWN DISPOSABLE) ×2 IMPLANT
PACK TRENDGUARD 450 HYBRID PRO (MISCELLANEOUS) ×1 IMPLANT
PAD PREP 24X48 CUFFED NSTRL (MISCELLANEOUS) ×2 IMPLANT
PROTECTOR NERVE ULNAR (MISCELLANEOUS) ×4 IMPLANT
SEAL CANN UNIV 5-8 DVNC XI (MISCELLANEOUS) ×4 IMPLANT
SEAL XI 5MM-8MM UNIVERSAL (MISCELLANEOUS) ×4
SEALER VESSEL DA VINCI XI (MISCELLANEOUS) ×1
SEALER VESSEL EXT DVNC XI (MISCELLANEOUS) ×1 IMPLANT
SET IRRIG Y TYPE TUR BLADDER L (SET/KITS/TRAYS/PACK) IMPLANT
SET TRI-LUMEN FLTR TB AIRSEAL (TUBING) ×2 IMPLANT
SUT VIC AB 0 CT1 36 (SUTURE) ×4 IMPLANT
SUT VICRYL 4-0 PS2 18IN ABS (SUTURE) ×4 IMPLANT
SUT VLOC 180 0 6IN GS21 (SUTURE) ×2 IMPLANT
SUT VLOC 180 0 9IN  GS21 (SUTURE) ×1
SUT VLOC 180 0 9IN GS21 (SUTURE) ×1 IMPLANT
TIP RUMI ORANGE 6.7MMX12CM (TIP) IMPLANT
TIP UTERINE 5.1X6CM LAV DISP (MISCELLANEOUS) IMPLANT
TIP UTERINE 6.7X10CM GRN DISP (MISCELLANEOUS) ×2 IMPLANT
TIP UTERINE 6.7X6CM WHT DISP (MISCELLANEOUS) IMPLANT
TIP UTERINE 6.7X8CM BLUE DISP (MISCELLANEOUS) IMPLANT
TOWEL OR 17X26 10 PK STRL BLUE (TOWEL DISPOSABLE) ×4 IMPLANT
TRENDGUARD 450 HYBRID PRO PACK (MISCELLANEOUS) ×2
TROCAR PORT AIRSEAL 8X120 (TROCAR) ×2 IMPLANT
WATER STERILE IRR 1000ML POUR (IV SOLUTION) ×2 IMPLANT

## 2019-10-21 NOTE — Brief Op Note (Signed)
10/21/2019  4:22 PM  PATIENT:  Erika Eaton  44 y.o. female  PRE-OPERATIVE DIAGNOSIS:  Symptomatic Uterine Fibroid  POST-OPERATIVE DIAGNOSIS:  Symptomatic Uterine Fibroid  PROCEDURE:  Procedure(s): XI ROBOTIC ASSISTED LAPAROSCOPIC HYSTERECTOMY AND SALPINGECTOMY (Bilateral)  SURGEON:  Surgeon(s) and Role:    * Servando Salina, MD - Primary    * Azucena Fallen, MD - Assisting  PHYSICIAN ASSISTANT:   ASSISTANTS: Azucena Fallen, MD   ANESTHESIA:   general Findings: nl ovaries,  large fibroid uterus with intracavity degenerating fibroids, nl tubes EBL:  40 mL   BLOOD ADMINISTERED:none  DRAINS: none   LOCAL MEDICATIONS USED:  MARCAINE     SPECIMEN:  Source of Specimen:  uterus with cervix, tubes  DISPOSITION OF SPECIMEN:  PATHOLOGY  COUNTS:  YES  TOURNIQUET:  * No tourniquets in log *  DICTATION: .Other Dictation: Dictation Number [id number]  PLAN OF CARE: Admit for overnight observation  PATIENT DISPOSITION:  PACU - hemodynamically stable.   Delay start of Pharmacological VTE agent (>24hrs) due to surgical blood loss or risk of bleeding: no

## 2019-10-21 NOTE — Anesthesia Postprocedure Evaluation (Signed)
Anesthesia Post Note  Patient: Erika Eaton  Procedure(s) Performed: XI ROBOTIC ASSISTED LAPAROSCOPIC HYSTERECTOMY AND SALPINGECTOMY (Bilateral Abdomen)     Patient location during evaluation: PACU Anesthesia Type: General Level of consciousness: awake and alert Pain management: pain level controlled Vital Signs Assessment: post-procedure vital signs reviewed and stable Respiratory status: spontaneous breathing, nonlabored ventilation, respiratory function stable and patient connected to nasal cannula oxygen Cardiovascular status: blood pressure returned to baseline and stable Postop Assessment: no apparent nausea or vomiting Anesthetic complications: no    Last Vitals:  Vitals:   10/21/19 1755 10/21/19 1815  BP: (!) 151/102 (!) 150/95  Pulse: 86 80  Resp: 16 16  Temp: 36.9 C 37.6 C  SpO2: 100% 99%                  Effie Berkshire

## 2019-10-21 NOTE — Progress Notes (Signed)
Pt repeat temp 100.6 oral, encouraged incentive spirometry up to 2250 performed, Dr. Garwin Brothers made aware, orders received for Tylenol. Will continue to monitor and manage.

## 2019-10-21 NOTE — Anesthesia Preprocedure Evaluation (Signed)
Anesthesia Evaluation  Patient identified by MRN, date of birth, ID band Patient awake    Reviewed: Allergy & Precautions, NPO status , Patient's Chart, lab work & pertinent test results  Airway Mallampati: I       Dental no notable dental hx. (+) Teeth Intact   Pulmonary neg pulmonary ROS,    Pulmonary exam normal breath sounds clear to auscultation       Cardiovascular Normal cardiovascular exam Rhythm:Regular Rate:Normal     Neuro/Psych negative neurological ROS  negative psych ROS   GI/Hepatic   Endo/Other    Renal/GU   negative genitourinary   Musculoskeletal negative musculoskeletal ROS (+)   Abdominal (+) + obese,   Peds  Hematology negative hematology ROS (+)   Anesthesia Other Findings   Reproductive/Obstetrics negative OB ROS                             Anesthesia Physical Anesthesia Plan  ASA: II  Anesthesia Plan: General   Post-op Pain Management:    Induction: Intravenous  PONV Risk Score and Plan: 4 or greater and Ondansetron, Dexamethasone, Midazolam and Scopolamine patch - Pre-op  Airway Management Planned: Oral ETT  Additional Equipment: None  Intra-op Plan:   Post-operative Plan: Extubation in OR  Informed Consent: I have reviewed the patients History and Physical, chart, labs and discussed the procedure including the risks, benefits and alternatives for the proposed anesthesia with the patient or authorized representative who has indicated his/her understanding and acceptance.     Dental advisory given  Plan Discussed with: CRNA  Anesthesia Plan Comments:         Anesthesia Quick Evaluation

## 2019-10-21 NOTE — Anesthesia Procedure Notes (Signed)
Procedure Name: Intubation Date/Time: 10/21/2019 12:08 PM Performed by: Suan Halter, CRNA Pre-anesthesia Checklist: Patient identified, Emergency Drugs available, Suction available and Patient being monitored Patient Re-evaluated:Patient Re-evaluated prior to induction Oxygen Delivery Method: Circle system utilized Preoxygenation: Pre-oxygenation with 100% oxygen Induction Type: IV induction Ventilation: Mask ventilation without difficulty Laryngoscope Size: Mac and 3 Grade View: Grade I Tube type: Oral Tube size: 7.0 mm Number of attempts: 1 Airway Equipment and Method: Stylet and Oral airway Placement Confirmation: ETT inserted through vocal cords under direct vision,  positive ETCO2 and breath sounds checked- equal and bilateral Secured at: 22 cm Tube secured with: Tape Dental Injury: Teeth and Oropharynx as per pre-operative assessment

## 2019-10-21 NOTE — Progress Notes (Signed)
During routine VS check, patient's oral temp 100.3. Removed 3 extra blankets off of patient, removed Kpad, placed cool rags on patient's forehead/neck, and had patient do ISP. Also encouraged oral intake. VSS, will continue to monitor.

## 2019-10-21 NOTE — Transfer of Care (Signed)
Immediate Anesthesia Transfer of Care Note  Patient: Erika Eaton  Procedure(s) Performed: Procedure(s) (LRB): XI ROBOTIC ASSISTED LAPAROSCOPIC HYSTERECTOMY AND SALPINGECTOMY (Bilateral)  Patient Location: PACU  Anesthesia Type: General  Level of Consciousness: awake, oriented, sedated and patient cooperative  Airway & Oxygen Therapy: Patient Spontanous Breathing and Patient connected to face mask oxygen  Post-op Assessment: Report given to PACU RN and Post -op Vital signs reviewed and stable  Post vital signs: Reviewed and stable  Complications: No apparent anesthesia complications Last Vitals:  Vitals Value Taken Time  BP 148/93 10/21/19 1615  Temp 36.7 C 10/21/19 1615  Pulse 83 10/21/19 1621  Resp 19 10/21/19 1621  SpO2 100 % 10/21/19 1621  Vitals shown include unvalidated device data.  Last Pain:  Vitals:   10/21/19 1130  TempSrc: Oral  PainSc: 0-No pain      Patients Stated Pain Goal: 4 (10/21/19 1130)

## 2019-10-22 DIAGNOSIS — D259 Leiomyoma of uterus, unspecified: Secondary | ICD-10-CM | POA: Diagnosis not present

## 2019-10-22 LAB — BASIC METABOLIC PANEL
Anion gap: 7 (ref 5–15)
BUN: 7 mg/dL (ref 6–20)
CO2: 26 mmol/L (ref 22–32)
Calcium: 8.5 mg/dL — ABNORMAL LOW (ref 8.9–10.3)
Chloride: 102 mmol/L (ref 98–111)
Creatinine, Ser: 0.84 mg/dL (ref 0.44–1.00)
GFR calc Af Amer: >60 mL/min (ref >60)
GFR calc non Af Amer: >60 mL/min (ref >60)
Glucose, Bld: 153 mg/dL — ABNORMAL HIGH (ref 70–99)
Potassium: 3.5 mmol/L (ref 3.5–5.1)
Sodium: 135 mmol/L (ref 135–145)

## 2019-10-22 LAB — CBC
HCT: 35.8 % — ABNORMAL LOW (ref 36.0–46.0)
Hemoglobin: 11.6 g/dL — ABNORMAL LOW (ref 12.0–15.0)
MCH: 26 pg (ref 26.0–34.0)
MCHC: 32.4 g/dL (ref 30.0–36.0)
MCV: 80.1 fL (ref 80.0–100.0)
Platelets: 235 10*3/uL (ref 150–400)
RBC: 4.47 MIL/uL (ref 3.87–5.11)
RDW: 17.2 % — ABNORMAL HIGH (ref 11.5–15.5)
WBC: 9.1 10*3/uL (ref 4.0–10.5)
nRBC: 0 % (ref 0.0–0.2)

## 2019-10-22 MED ORDER — OXYCODONE HCL 5 MG PO TABS
ORAL_TABLET | ORAL | Status: AC
  Filled 2019-10-22: qty 2

## 2019-10-22 MED ORDER — KETOROLAC TROMETHAMINE 30 MG/ML IJ SOLN
INTRAMUSCULAR | Status: AC
  Filled 2019-10-22: qty 1

## 2019-10-22 MED ORDER — MENTHOL 3 MG MT LOZG
LOZENGE | OROMUCOSAL | Status: AC
  Filled 2019-10-22: qty 9

## 2019-10-22 MED ORDER — OXYCODONE HCL 5 MG PO TABS: 5.0000 mg | ORAL_TABLET | ORAL | 0 refills | Status: AC | PRN

## 2019-10-22 MED ORDER — IBUPROFEN 800 MG PO TABS: 800.0000 mg | ORAL_TABLET | Freq: Four times a day (QID) | ORAL | 4 refills | Status: AC | PRN

## 2019-10-22 MED ORDER — OXYCODONE HCL 5 MG PO TABS: 5.0000 mg | ORAL_TABLET | ORAL | 0 refills | Status: DC | PRN

## 2019-10-22 MED FILL — oxyCODONE HCL 5 MG TABS: 5 | 3 days supply | Qty: 30 | Fill #0

## 2019-10-22 NOTE — Discharge Instructions (Signed)

## 2019-10-22 NOTE — Op Note (Signed)
NAME: Erika Eaton, Erika Eaton MEDICAL RECORD [id number] ACCOUNT [id number] DATE OF BIRTH:1975-12-01 FACILITY: [facility] LOCATION: [facility] PHYSICIAN:Marieme Mcmackin A. Jarron Curley, MD  OPERATIVE REPORT  DATE OF PROCEDURE:  10/21/2019  PREOPERATIVE DIAGNOSIS:  Symptomatic uterine fibroids, cervical intraepithelial neoplasm 2.  PROCEDURE:  Da Vinci robotic total hysterectomy, bilateral salpingectomy.  POSTOPERATIVE DIAGNOSIS:  Symptomatic uterine fibroids, cervical intraepithelial neoplasm 2  ANESTHESIA:  General.  SURGEON:  Servando Salina, MD  ASSISTANTS:  Derrell Lolling, CNM,              Azucena Fallen MD   DESCRIPTION OF PROCEDURE:  Under adequate general anesthesia, the patient was placed in the dorsal lithotomy position.  She was sterilely prepped and draped for robotic surgery.  Examination under anesthesia revealed an irregular enlarged 12-week size  uterus.  No adnexal masses could be appreciated.  Using the weighted speculum and a Sims retractor the cervix was visualized deep in the upper part of the vagina.  Anterior lip of the cervix was grasped with a single tooth tenaculum and 0 Vicryl figure-of-eight sutures were placed on the anterior and posterior lip.  Uterus sounded to 11 cm.  A #10 uterine manipulator with a medium RUMI cup was utilized and the retractors were removed.  Attention was then turned to the abdomen.  Marcaine 0.5% was  injected supraumbilically.  Small vertical incision was then made.  A Veress needle was introduced and tested with sterile water.  The robotic camera port was then introduced into the abdomen without incident.  The robotic camera scope was then introduced confirming entry into the abdomen without incident.  Panoramic inspection was notable for large uterus with an anterior appearing fibroid.  Normal liver edge.  The patient was then placed in deep Trendelenburg position.  Two robotic ports were  placed 8-10 cm apart on the right and most laterally on the  left with an intervening 8 mm robotic AirSeal placement.  The robot was then docked.  In arm #1 was the vessel sealer.  Arm #2 was attached to the camera for the camera port and #3 was the long  bipolar forceps and monopolar scissors in the 4th arm.  I then went to the surgical console.  At the surgical console, panoramic inspection was done, both ureters were seen peristalsing.  The left tube had a proximal Filshie clip still present which was partially protruding off the fallopian tube.  The fallopian tube in 2 places was suspended by adhesions to the left anterior abdominal wall.  Procedure was started by lysing the fallopian tube off the anterior abdominal wall.  The mesosalpinx of the  fallopian tube was left serially clamped, cauterized and cut with removal of the tube.  Left retroperitoneal space was opened.  A small window in the posterior leaf of the broad ligament was then done.  The left uteroovarian ligament was serially clamped, cauterized, and cut.  The posterior leaf of the broad ligament was then considered down further.  The round ligament was clamped, cauterized, and cut.  An anterior leaf of the broad ligament was opened and carried over to the 2/3 of the  vesicouterine peritoneum, which was opened sharply and extended sharply inferiorly.  Uterine vessels were noted to be tortuous.  Uterine vessels were then clamped, cauterized, and cut at that point.  Bleeding was noted from the now pedicle.  Using the  bipolar grasper additional cauterization was done with continued bleeding.  The vaginal insufflator was then put in place.  The proximal portion of the uterine  vessels were cauterized again when hemostasis was finally achieved.  Attention was then turned to the opposite side.  The ureter again was identified.  The same procedure was then performed with uterine vessels noted to be very serpentine on the right.  On the left, using the bipolar grasper, the vessels were serially clamped,  cauterized and then  cut.  Once this was felt to be completed the cervicovaginal junction at the upper part of the RUMI cup was identified and opened anteriorly.  The vessels on the lateral sides were then again clamped and then cut.  The uterus which was very top heavy was held up posteriorly and the circumferential incision was carried around posteriorly with finally severing the cervix from its vaginal attachment.  The uterus was not able to be brought through the vagina on initial attempt.  Therefore, the cervix was grasped vaginally.  Attempt at delivery it with cutting, morcellation vaginally was partially started.  This still also was not successful due to coring was not able to perform well by the narrow vaginal wall.  Therefore, in the abdomen the fundus was opened vertically  and again degenerating fibroid was subsequently encountered, which appeared to be posterior intracavitary fibroid.  Due to its degeneration, it was unable to be brought up out of its space.  Subsequent manipulation vaginally resulted in the specimen to be removed intact.  Once this was done, the vaginal insufflator was replaced.  The vaginal cuff was inspected.  Bladder was displaced further and sharp dissection displaced inferiorly.  Posteriorly, small bleeder was cauterized.  There was a small  extension of the incision clear in the left anterior aspect of the vaginal cuff.  With the instruments being replaced by the long tip forceps and the l mega suture needle driver, a 0 Vicryl figure-of-eight suture x2 was placed at this small anterior extension.  The vaginal cuff, after achieving hemostasis, was then closed with a 0 V-Loc suture x2.  The abdomen was irrigated and suctioned of debris.  The pressure was decreased to 8,some bleeding was recognized, leading to additional areas cauterized, and  this was continued until adequate hemostasis was achieved, at which time, the abdomen was irrigated, suctioned of debris, and Arista  potato starch was placed overlying the vaginal cuff.  The robotic instruments were removed.  The robot was undocked.  I  went back sterilely to the patient's bedside while she was in Trendelenburg position.  Upper abdominal fluid was suctioned.  The patient was then leveled out.  The pedicles were again inspected.  Good hemostasis was achieved.  The ports were removed.  The abdomen  was deflated.  The AirSeal was removed.  The incisions were then closed with 4-0 Vicryl subcuticular closures and the vagina was inspected digitally and palpable with good hemostasis noted.  SPECIMEN:  Uterus with cervix, fallopian tubes were sent to pathology.  Specimen weighed 499 grams.  INTRAOPERATIVE FLUIDS:  A liter.  URINE OUTPUT:  300 mL.  ESTIMATED BLOOD LOSS:  40 mL.  COUNTS:  Sponge and instrument counts x2 were correct.  COMPLICATIONS:  None.  The patient tolerated the procedure well and was transferred to recovery in stable condition.  CN/NUANCE  D:10/21/2019 T:10/22/2019 JOB:[id number]

## 2019-10-22 NOTE — Progress Notes (Signed)
Dr Garwin Brothers made aware of temp of 100.4 oral despite incentive spirometry use.  Order received to obtain urine culture and d/c patient home.  Awaiting on patient to be able to void.

## 2019-10-22 NOTE — Progress Notes (Signed)
Subjective: Patient reports tolerating PO denies back pain. Has voided twice w/o problem. C/o having had fever yesterday.    Objective: I have reviewed patient's vital signs.  vital signs, intake and output and labs. Vitals:   10/22/19 0212 10/22/19 0601  BP: 134/81 126/80  Pulse: 91 89  Resp: 16 14  Temp: 99.9 F (37.7 C) 98.1 F (36.7 C)  SpO2: 95% 98%   I/O last 3 completed shifts: In: 2325 [I.V.:2225; IV Piggyback:100] Out: 2885 [Urine:2450; Emesis/NG output:395; Blood:40] Total I/O In: 1131.3 [I.V.:1131.3] Out: 350 [Urine:350]  Lab Results  Component Value Date   WBC 9.1 10/22/2019   HGB 11.6 (L) 10/22/2019   HCT 35.8 (L) 10/22/2019   MCV 80.1 10/22/2019   PLT 235 10/22/2019   Lab Results  Component Value Date   CREATININE 0.84 10/22/2019    EXAM General: alert, cooperative and no distress Resp: clear to auscultation bilaterally Cardio: regular rate and rhythm, S1, S2 normal, no murmur, click, rub or gallop GI: incision: clean, dry and intact and soft obese nontender active BS Extremities: extremities normal, atraumatic, no cyanosis or edema Vaginal Bleeding: minimal  Assessment: s/p Procedure(s): XI ROBOTIC ASSISTED LAPAROSCOPIC HYSTERECTOMY AND SALPINGECTOMY: stable, progressing well and tolerating diet  Postoperative fever thought to be related to atelectasis. Pt had 2nd dose of antibiotics in the operating room due to length of surgery. Pt has nl wbc and no clinical source on exam  Plan: Encourage ambulation Discontinue IV fluids Discharge home D/c instructions reviewed F/u 2 wk Script reviewed  LOS: 0 days    Marvene Staff, MD 10/22/2019 8:28 AM    10/22/2019, 8:28 AM

## 2019-10-23 LAB — URINE CULTURE
Culture: NO GROWTH
Special Requests: NORMAL

## 2019-10-25 LAB — SURGICAL PATHOLOGY

## 2019-10-25 NOTE — Discharge Summary (Signed)
Physician Discharge Summary  Patient ID: Erika Eaton MRN: [id number] DOB/AGE: 43-Dec-1977 43 y.o.  Admit date: 10/21/2019 Discharge date: 10/22/2019  Admission Diagnoses: symptomatic uterine fibroids, CIN 2  Discharge Diagnoses: symptomatic uterine fibroids, CIN 2 Active Problems:   Uterine fibroid   Status post total hysterectomy   Discharged Condition: stable  Hospital Course: pt underwent daVinci robotic total hysterectomy, bilateral salpingectomy. Postop course notable for fever on POD #0 w/o clinical source. Nl wbc. Thought related to atelectasis  Consults: None  Significant Diagnostic Studies: labs:  CBC    Component Value Date/Time   WBC 9.1 10/22/2019 0442   RBC 4.47 10/22/2019 0442   HGB 11.6 (L) 10/22/2019 0442   HCT 35.8 (L) 10/22/2019 0442   PLT 235 10/22/2019 0442   MCV 80.1 10/22/2019 0442   MCH 26.0 10/22/2019 0442   MCHC 32.4 10/22/2019 0442   RDW 17.2 (H) 10/22/2019 0442   LYMPHSABS 0.4 (L) 10/19/2014 1029   MONOABS 0.3 10/19/2014 1029   EOSABS 0.0 10/19/2014 1029   BASOSABS 0.0 10/19/2014 1029     Treatments: surgery: Davinci robotic total hysterectomy, bilateral salpingectomy  Discharge Exam: Blood pressure 126/79, pulse 98, temperature (!) 100.4 F (38 C), resp. rate 16, height 5\' 6"  (1.676 m), weight 87.2 kg, last menstrual period 10/08/2019, SpO2 96 %. General appearance: alert, cooperative and no distress Back: no tenderness to percussion or palpation Resp: clear to auscultation bilaterally Cardio: regular rate and rhythm, S1, S2 normal, no murmur, click, rub or gallop GI: soft obese (+) BS Extremities: no edema, redness or tenderness in the calves or thighs Incision/Wound:  Disposition: Discharge disposition: 01-Home or Self Care       Discharge Instructions    Call MD for:  persistant nausea and vomiting   Complete by: As directed    Call MD for:  severe uncontrolled pain   Complete by: As directed    Call MD for:   temperature >100.4   Complete by: As directed    Diet - low sodium heart healthy   Complete by: As directed    Discharge instructions   Complete by: As directed    Call if temperature greater than equal to 100.4, nothing per vagina for 4-6 weeks or severe nausea vomiting, increased incisional pain , drainage or redness in the incision site, no straining with bowel movements, showers no bath   May walk up steps   Complete by: As directed      Allergies as of 10/22/2019      Reactions   Chocolate Rash   Robaxin [methocarbamol] Rash   Soda Lime Rash   ALL SODA      Medication List    TAKE these medications   acetaminophen 500 MG tablet Commonly known as: TYLENOL Take 1,000 mg by mouth every 6 (six) hours as needed for moderate pain.   ibuprofen 800 MG tablet Commonly known as: ADVIL Take 1 tablet (800 mg total) by mouth every 6 (six) hours as needed. Notes to patient: May take next dose at 10:00 am   oxyCODONE 5 MG immediate release tablet Commonly known as: Oxy IR/ROXICODONE Take 1-2 tablets (5-10 mg total) by mouth every 4 (four) hours as needed for up to 7 days for moderate pain. Notes to patient: May take next dose at 12:00 noon today        Signed: Reyne Falconi A Wyona Neils 10/25/2019, 8:44 PM

## 2019-11-10 ENCOUNTER — Ambulatory Visit: Payer: No Typology Code available for payment source | Admitting: Adult Health Nurse Practitioner

## 2019-11-11 ENCOUNTER — Encounter: Payer: Self-pay | Admitting: Adult Health Nurse Practitioner

## 2020-11-02 ENCOUNTER — Ambulatory Visit (INDEPENDENT_AMBULATORY_CARE_PROVIDER_SITE_OTHER): Payer: No Typology Code available for payment source | Admitting: Internal Medicine

## 2020-11-02 ENCOUNTER — Other Ambulatory Visit: Payer: Self-pay

## 2020-11-02 ENCOUNTER — Other Ambulatory Visit (HOSPITAL_COMMUNITY): Payer: Self-pay | Admitting: Internal Medicine

## 2020-11-02 ENCOUNTER — Encounter: Payer: Self-pay | Admitting: Internal Medicine

## 2020-11-02 VITALS — BP 147/108 | HR 90 | Temp 98.2°F | Ht 66.0 in | Wt 220.5 lb

## 2020-11-02 DIAGNOSIS — H538 Other visual disturbances: Secondary | ICD-10-CM | POA: Diagnosis not present

## 2020-11-02 DIAGNOSIS — Z0000 Encounter for general adult medical examination without abnormal findings: Secondary | ICD-10-CM | POA: Diagnosis not present

## 2020-11-02 DIAGNOSIS — I10 Essential (primary) hypertension: Secondary | ICD-10-CM | POA: Diagnosis not present

## 2020-11-02 DIAGNOSIS — Z131 Encounter for screening for diabetes mellitus: Secondary | ICD-10-CM | POA: Diagnosis not present

## 2020-11-02 DIAGNOSIS — F32A Depression, unspecified: Secondary | ICD-10-CM

## 2020-11-02 DIAGNOSIS — F320 Major depressive disorder, single episode, mild: Secondary | ICD-10-CM

## 2020-11-02 DIAGNOSIS — Z136 Encounter for screening for cardiovascular disorders: Secondary | ICD-10-CM | POA: Insufficient documentation

## 2020-11-02 HISTORY — DX: Depression, unspecified: F32.A

## 2020-11-02 LAB — POCT GLYCOSYLATED HEMOGLOBIN (HGB A1C): Hemoglobin A1C: 5.5 % (ref 4.0–5.6)

## 2020-11-02 LAB — GLUCOSE, CAPILLARY: Glucose-Capillary: 84 mg/dL (ref 70–99)

## 2020-11-02 MED ORDER — AMLODIPINE BESYLATE 10 MG PO TABS: 10.0000 mg | ORAL_TABLET | Freq: Every day | ORAL | 11 refills | Status: DC

## 2020-11-02 MED ORDER — HYDROCHLOROTHIAZIDE 25 MG PO TABS: 25.0000 mg | ORAL_TABLET | Freq: Every day | ORAL | 1 refills | Status: DC

## 2020-11-02 NOTE — Patient Instructions (Signed)
Thank you, Ms.Elita Quick for allowing [place] to provide your care today. Today we discussed hypertension, sleep study ,screening for diabetes, and referral to eye doctor. .    I have ordered the following labs for you:  Lab Orders  No laboratory test(s) ordered today     Tests ordered today:   Referrals ordered today:   Referral Orders  No referral(s) requested today     I have ordered the following medication/changed the following medications:   Stop the following medications: There are no discontinued medications.   Start the following medications: No orders of the defined types were placed in this encounter.    Follow up: 2-3 weeks   Remember: I will call with your lab results.   Should you have any questions or concerns please call the internal medicine clinic at [phone].      Tamsen Snider, M.D. [place]

## 2020-11-02 NOTE — Assessment & Plan Note (Signed)
Patient reports she has had her flu shot and her Covid vaccine.  She will bring her cards to the next visit so they can be updated in EMR.

## 2020-11-02 NOTE — Assessment & Plan Note (Signed)
Depression, PHQ-9: Based on the patients  [place] Visit from 11/02/2020 in [place]  PHQ-9 Total Score 7     score we have suggest mild depression.  Patient has felt more fatigued recently.  We are sending her for a sleep study given she has hypertension and high stop-bang score.  We will continue to monitor and see if patient feels better with treatment of these other conditions.

## 2020-11-02 NOTE — Assessment & Plan Note (Addendum)
  Patient's BP today is 147/108 with a goal of <140/80. The patient is not currently on any medications.  She reports she had hypertension during pregnancy.  Recently her blood pressure was checked at home by a friend who is a Designer, jewellery.  She reports her systolic blood pressure has been in the 170s.    Assessment/Plan: Essential hypertension. She has had some blurred vision and headaches associated with her high blood pressure.  We will refer her to ophthalmology because she has not had an eye exam in many years.  Stop bang score 3, high risk of OSA . We will refer her for a sleep study.   - BMP8+Anion Gap - CBC with Diff - Ambulatory referral to Ophthalmology - hydrochlorothiazide (HYDRODIURIL) 25 MG tablet; Take 1 tablet (25 mg total) by mouth daily.  Dispense: 30 tablet; Refill: 1 - amLODipine (NORVASC) 10 MG tablet; Take 1 tablet (10 mg total) by mouth daily.  Dispense: 30 tablet; Refill: 11 - Home sleep test

## 2020-11-02 NOTE — Progress Notes (Signed)
   CC: hypertension, headaches and blurred vision,   HPI:[name] is a 45 y.o. female who presents for evaluation of hypertension. Please see individual problem based A/P for details.   Past Medical History:  Diagnosis Date  . Abnormal Pap smear   . Herpes simplex   . History of pre-eclampsia   . Hypertension    not currently taking any medications  . Iron deficiency anemia    IV iron infusions   Past Surgical History:  Procedure Laterality Date  . EYE SURGERY     after trauma has a child  . ROBOTIC ASSISTED LAPAROSCOPIC HYSTERECTOMY AND SALPINGECTOMY Bilateral 10/21/2019   Procedure: XI ROBOTIC ASSISTED LAPAROSCOPIC HYSTERECTOMY AND SALPINGECTOMY;  Surgeon: Servando Salina, MD;  Location: [facility];  Service: Gynecology;  Laterality: Bilateral;  . TUBAL LIGATION    . TUBAL LIGATION  06/15/2011   Procedure: POST PARTUM TUBAL LIGATION;  Surgeon: Emeterio Reeve, MD;  Location: [facility];  Service: Gynecology;  Laterality: Bilateral;  bilateral post partum tubal ligation with filshie clips.   Family History  Problem Relation Age of Onset  . Hypertension Mother   . Hypertension Sister   . Hypertension Maternal Grandmother   . Asthma Maternal Grandmother   . Diabetes Maternal Grandmother    Social History   Tobacco Use  . Smoking status: Never Smoker  . Smokeless tobacco: Never Used  Vaping Use  . Vaping Use: Never used  Substance Use Topics  . Alcohol use: Yes    Alcohol/week: 2.0 standard drinks    Types: 2 Glasses of wine per week    Comment: 2 glasses per week  . Drug use: No     Review of Systems:   Review of Systems  Constitutional: Negative for chills, fever and weight loss.  HENT: Negative for hearing loss and tinnitus.   Eyes: Positive for blurred vision. Negative for pain.  Respiratory: Negative for cough and shortness of breath.   Cardiovascular: Negative for chest pain and leg swelling.  Gastrointestinal: Negative for heartburn  and nausea.  Genitourinary: Negative for dysuria and urgency.  Neurological: Positive for headaches. Negative for dizziness.  Psychiatric/Behavioral: Positive for depression (mild ). Negative for substance abuse. The patient does not have insomnia.      Physical Exam: Vitals:   11/02/20 1437  BP: (!) 147/108  Pulse: 90  Temp: 98.2 F (36.8 C)  TempSrc: Oral  SpO2: 96%  Weight: 220 lb 8 oz (100 kg)  Height: 5\' 6"  (1.676 m)    General: NAD, nl appearance HE: Normocephalic, atraumatic , EOMI, Conjunctivae normal ENT: No congestion, no rhinorrhea, no exudate or erythema  Cardiovascular: Normal rate, regular rhythm.  No murmurs, rubs, or gallops Pulmonary : Effort normal, breath sounds normal. No wheezes, rales, or rhonchi Abdominal: soft, nontender,  bowel sounds present Musculoskeletal: no swelling , deformity, injury ,or tenderness in extremities, Skin: Warm, dry , no bruising, erythema, or rash Psychiatric/Behavioral:  normal mood, normal behavior ]    Assessment & Plan:   See Encounters Tab for problem based charting.  Patient discussed with Dr. Dareen Piano

## 2020-11-03 LAB — CBC WITH DIFFERENTIAL/PLATELET
Basophils Absolute: 0 10*3/uL (ref 0.0–0.2)
Basos: 0 %
EOS (ABSOLUTE): 0.1 10*3/uL (ref 0.0–0.4)
Eos: 3 %
Hematocrit: 38.8 % (ref 34.0–46.6)
Hemoglobin: 12.6 g/dL (ref 11.1–15.9)
Immature Grans (Abs): 0 10*3/uL (ref 0.0–0.1)
Immature Granulocytes: 0 %
Lymphocytes Absolute: 1.9 10*3/uL (ref 0.7–3.1)
Lymphs: 36 %
MCH: 26.8 pg (ref 26.6–33.0)
MCHC: 32.5 g/dL (ref 31.5–35.7)
MCV: 82 fL (ref 79–97)
Monocytes Absolute: 0.4 10*3/uL (ref 0.1–0.9)
Monocytes: 7 %
Neutrophils Absolute: 2.8 10*3/uL (ref 1.4–7.0)
Neutrophils: 54 %
Platelets: 212 10*3/uL (ref 150–450)
RBC: 4.71 x10E6/uL (ref 3.77–5.28)
RDW: 14.4 % (ref 11.7–15.4)
WBC: 5.2 10*3/uL (ref 3.4–10.8)

## 2020-11-03 LAB — BMP8+ANION GAP
Anion Gap: 16 mmol/L (ref 10.0–18.0)
BUN/Creatinine Ratio: 15 (ref 9–23)
BUN: 11 mg/dL (ref 6–24)
CO2: 24 mmol/L (ref 20–29)
Calcium: 8.8 mg/dL (ref 8.7–10.2)
Chloride: 100 mmol/L (ref 96–106)
Creatinine, Ser: 0.75 mg/dL (ref 0.57–1.00)
GFR calc Af Amer: 113 mL/min/{1.73_m2} (ref >59)
GFR calc non Af Amer: 98 mL/min/{1.73_m2} (ref >59)
Glucose: 88 mg/dL (ref 65–99)
Potassium: 3.8 mmol/L (ref 3.5–5.2)
Sodium: 140 mmol/L (ref 134–144)

## 2020-11-06 ENCOUNTER — Encounter: Payer: No Typology Code available for payment source | Admitting: Internal Medicine

## 2020-11-06 NOTE — Progress Notes (Signed)
Internal Medicine Clinic Attending  Case discussed with Dr. Steen  At the time of the visit.  We reviewed the resident's history and exam and pertinent patient test results.  I agree with the assessment, diagnosis, and plan of care documented in the resident's note.  

## 2020-11-15 ENCOUNTER — Other Ambulatory Visit (HOSPITAL_COMMUNITY): Payer: Self-pay

## 2020-11-15 MED FILL — OMRON 3 SERIES BP MONITOR D: 1 days supply | Qty: 1 | Fill #0

## 2020-11-23 ENCOUNTER — Ambulatory Visit (INDEPENDENT_AMBULATORY_CARE_PROVIDER_SITE_OTHER): Payer: No Typology Code available for payment source | Admitting: Student

## 2020-11-23 ENCOUNTER — Encounter: Payer: Self-pay | Admitting: Student

## 2020-11-23 ENCOUNTER — Other Ambulatory Visit: Payer: Self-pay

## 2020-11-23 VITALS — BP 123/79 | HR 88 | Temp 98.4°F | Ht 66.0 in | Wt 219.0 lb

## 2020-11-23 DIAGNOSIS — Z0000 Encounter for general adult medical examination without abnormal findings: Secondary | ICD-10-CM

## 2020-11-23 DIAGNOSIS — I10 Essential (primary) hypertension: Secondary | ICD-10-CM

## 2020-11-23 NOTE — Assessment & Plan Note (Signed)
-  Up-to-date on Covid vaccines and tetanus shot

## 2020-11-23 NOTE — Patient Instructions (Signed)
[name],  It is a pleasure seeing you in the clinic today.  I am glad that your blood pressure is under control.  Please continue medication as instructed.  I will call you for the lab result today.  Please follow-up with the ophthalmology and sleep study.  I will see you back in 3 months  To wean

## 2020-11-23 NOTE — Assessment & Plan Note (Addendum)
Blood pressure 123/79 today.  States that systolic blood pressure measured at home stay in the 120s.  Reports tolerating the medication well.  She is waiting for appointment for ophthalmology and sleep study.  Assessment and plan Blood pressure well controlled on HCTZ and amlodipine.  We will repeat BMP today.  -Continue HCTZ 25 mg daily -Continue amlodipine 10 mg daily -Repeat BMP today -Follow-up on ophthalmology visit and sleep study -Return in 62-month

## 2020-11-23 NOTE — Progress Notes (Signed)
   CC: Hypertension follow-up  HPI:  [name] is a 45 y.o. female with past medical history of hypertension, uterine fibroids status post hysterectomy and bilateral salpingectomy in February 2021, who presents to clinic for blood pressure follow-up.  Please see problem based charting for further detail  Past Medical History:  Diagnosis Date  . Abnormal Pap smear   . Herpes simplex   . History of pre-eclampsia   . Hypertension    not currently taking any medications  . Iron deficiency anemia    IV iron infusions   Review of Systems: As per HPI  Physical Exam:  Vitals:   11/23/20 1010  BP: 123/79  Pulse: 88  Temp: 98.4 F (36.9 C)  TempSrc: Oral  SpO2: 96%  Weight: 219 lb (99.3 kg)  Height: 5\' 6"  (1.676 m)   Physical Exam Constitutional:      General: She is not in acute distress. HENT:     Head: Normocephalic.  Eyes:     General:        Right eye: No discharge.        Left eye: No discharge.  Cardiovascular:     Rate and Rhythm: Normal rate and regular rhythm.  Pulmonary:     Effort: Pulmonary effort is normal. No respiratory distress.  Musculoskeletal:     Right lower leg: No edema.     Left lower leg: No edema.  Skin:    General: Skin is warm.  Neurological:     Mental Status: She is alert.  Psychiatric:        Mood and Affect: Mood normal.     Assessment & Plan:   See Encounters Tab for problem based charting.  Patient discussed with Dr. Rebeca Alert

## 2020-11-24 LAB — BMP8+ANION GAP
Anion Gap: 18 mmol/L (ref 10.0–18.0)
BUN/Creatinine Ratio: 15 (ref 9–23)
BUN: 12 mg/dL (ref 6–24)
CO2: 24 mmol/L (ref 20–29)
Calcium: 9.3 mg/dL (ref 8.7–10.2)
Chloride: 99 mmol/L (ref 96–106)
Creatinine, Ser: 0.81 mg/dL (ref 0.57–1.00)
Glucose: 98 mg/dL (ref 65–99)
Potassium: 3.6 mmol/L (ref 3.5–5.2)
Sodium: 141 mmol/L (ref 134–144)
eGFR: 92 mL/min/{1.73_m2} (ref >59)

## 2020-11-24 NOTE — Progress Notes (Signed)
Internal Medicine Clinic Attending  Case discussed with Dr. Alfonse Spruce at the time of the visit.  We reviewed the resident's history and exam and pertinent patient test results.  I agree with the assessment, diagnosis, and plan of care documented in the resident's note.  Lenice Pressman, M.D., Ph.D.

## 2020-12-04 ENCOUNTER — Other Ambulatory Visit (HOSPITAL_COMMUNITY): Payer: Self-pay | Admitting: Internal Medicine

## 2020-12-04 ENCOUNTER — Telehealth: Payer: Self-pay

## 2020-12-04 ENCOUNTER — Telehealth: Payer: Self-pay | Admitting: Internal Medicine

## 2020-12-04 DIAGNOSIS — I10 Essential (primary) hypertension: Secondary | ICD-10-CM

## 2020-12-04 MED ORDER — HYDROCHLOROTHIAZIDE 25 MG PO TABS: 25.0000 mg | ORAL_TABLET | Freq: Every day | ORAL | 2 refills | Status: DC

## 2020-12-04 MED FILL — AMLODIPINE BESYLATE 10 MG T: 10 | 30 days supply | Qty: 30 | Fill #0

## 2020-12-04 MED FILL — HYDROCHLOROTHIAZIDE 25 MG T: 25 | 30 days supply | Qty: 30 | Fill #0

## 2020-12-04 NOTE — Telephone Encounter (Signed)
Pharmacy changed to [facility] in [place]. Call placed to patient. States she does not need refills at this time. She paid out of pocket for her BP meds this month at [facility]. She will contact [facility] and ask them to transfer meds from [facility] for next month.

## 2020-12-04 NOTE — Telephone Encounter (Signed)
Pt requesting the pharmacy changed to [facility] outpatient pharmacy.

## 2020-12-04 NOTE — Telephone Encounter (Signed)
Requested refill sent as below.    Essential hypertension - hydrochlorothiazide (HYDRODIURIL) 25 MG tablet; Take 1 tablet (25 mg total) by mouth daily.  Dispense: 30 tablet; Refill: 2

## 2020-12-28 ENCOUNTER — Other Ambulatory Visit (HOSPITAL_COMMUNITY): Payer: Self-pay

## 2020-12-28 ENCOUNTER — Other Ambulatory Visit: Payer: Self-pay

## 2020-12-28 DIAGNOSIS — I10 Essential (primary) hypertension: Secondary | ICD-10-CM

## 2020-12-28 MED ORDER — HYDROCHLOROTHIAZIDE 25 MG PO TABS
1.0000 | ORAL_TABLET | Freq: Every day | ORAL | 3 refills | Status: DC
  Filled 2020-12-28: qty 90, 90d supply, fill #0
  Filled 2021-03-30: qty 90, 90d supply, fill #1
  Filled 2021-07-02: qty 90, 90d supply, fill #2
  Filled 2021-10-01: qty 90, 90d supply, fill #3

## 2020-12-28 MED ORDER — AMLODIPINE BESYLATE 10 MG PO TABS
1.0000 | ORAL_TABLET | Freq: Every day | ORAL | 3 refills | Status: DC
  Filled 2020-12-28: qty 90, 90d supply, fill #0
  Filled 2021-03-30: qty 90, 90d supply, fill #1
  Filled 2021-07-02: qty 90, 90d supply, fill #2
  Filled 2021-10-01: qty 90, 90d supply, fill #3

## 2020-12-28 NOTE — Telephone Encounter (Signed)
Need refill on hydrochlorothiazide (HYDRODIURIL) 25 MG tablet amLODipine (NORVASC) 10 MG tablet ;pt contact [facility]

## 2020-12-28 NOTE — Telephone Encounter (Signed)
Next appt scheduled 6/10 with Dr Eileen Stanford.

## 2021-02-20 ENCOUNTER — Encounter: Payer: No Typology Code available for payment source | Admitting: Internal Medicine

## 2021-02-21 NOTE — Progress Notes (Deleted)
   CC: ***  HPI:  [name] is a 45 y.o. {GC/GE:3044014::"man","woman"} with history as below who presents to clinic for ***. {GC/GE:3044014::"His","Her"} last clinic visit was on ***.   To see the details of this patient's management of their acute and chronic problems, please refer to the Assessment & Plan under the Encounters tab.    Past Medical History:  Diagnosis Date  . Abnormal Pap smear   . Herpes simplex   . History of pre-eclampsia   . Hypertension    not currently taking any medications  . Iron deficiency anemia    IV iron infusions   Review of Systems:    ROS  Physical Exam:  There were no vitals filed for this visit. Constitutional: well-appearing {GC/GE:3044014::"man","woman"} sitting in chair, in no acute distress HENT: normocephalic atraumatic, mucous membranes moist Eyes: conjunctiva non-erythematous Neck: supple Cardiovascular: regular rate and rhythm, no m/r/g Pulmonary/Chest: normal work of breathing on room air, lungs clear to auscultation bilaterally Abdominal: soft, non-tender, non-distended MSK: normal bulk and tone Neurological: alert & oriented x 3, 5/5 strength in bilateral upper and lower extremities, normal gait Skin: warm and dry*** Psych: ***    Assessment & Plan:   See Encounters Tab for problem-based charting.  Patient {GC/GE:3044014::"discussed with","seen with"} Dr. {NAMES:3044014::"Williams","Guilloud","Hoffman","Mullen","Narendra","Raines","Vincent"}

## 2021-02-22 ENCOUNTER — Encounter: Payer: Self-pay | Admitting: Student

## 2021-02-22 ENCOUNTER — Ambulatory Visit (INDEPENDENT_AMBULATORY_CARE_PROVIDER_SITE_OTHER): Payer: No Typology Code available for payment source | Admitting: Student

## 2021-02-22 ENCOUNTER — Other Ambulatory Visit: Payer: Self-pay

## 2021-02-22 VITALS — BP 120/84 | HR 106 | Temp 97.9°F | Ht 66.0 in | Wt 220.0 lb

## 2021-02-22 DIAGNOSIS — I10 Essential (primary) hypertension: Secondary | ICD-10-CM

## 2021-02-23 ENCOUNTER — Encounter: Payer: No Typology Code available for payment source | Admitting: Internal Medicine

## 2021-03-01 NOTE — Progress Notes (Signed)
Encounter created in error

## 2021-03-06 ENCOUNTER — Other Ambulatory Visit (HOSPITAL_COMMUNITY)
Admission: RE | Admit: 2021-03-06 | Discharge: 2021-03-06 | Disposition: A | Discharge status: Home / Self Care | Payer: No Typology Code available for payment source | Source: Ambulatory Visit | Attending: Internal Medicine | Admitting: Internal Medicine

## 2021-03-06 ENCOUNTER — Ambulatory Visit (INDEPENDENT_AMBULATORY_CARE_PROVIDER_SITE_OTHER): Payer: No Typology Code available for payment source | Admitting: Student

## 2021-03-06 VITALS — BP 129/90 | HR 80 | Temp 98.1°F | Ht 66.0 in | Wt 218.4 lb

## 2021-03-06 DIAGNOSIS — Z0000 Encounter for general adult medical examination without abnormal findings: Secondary | ICD-10-CM | POA: Insufficient documentation

## 2021-03-06 DIAGNOSIS — I10 Essential (primary) hypertension: Secondary | ICD-10-CM | POA: Diagnosis not present

## 2021-03-06 NOTE — Assessment & Plan Note (Signed)
BP Readings from Last 3 Encounters:  03/06/21 129/90  02/22/21 120/84  11/23/20 123/79   Blood pressure continues to be at goal. Mentions she is compliant with medication, denies any side effects. Denies chest pain, dizziness, dyspnea, leg swelling. She is still waiting for an appointment to have her sleep study done. - Hydrochlorothiazide 25mg  daily - Amlodipine 10mg  daily - Return to clinic in 6 months

## 2021-03-06 NOTE — Assessment & Plan Note (Signed)
-   PAP smear today. Unfortunately, was unaware prior to examination and procedure that Erika Eaton had previously undergone total hysterectomy, including cervix, uterus, and fallopian tubes. She will not require PAP smears moving forward.  - Per chart review, she is up to date on tetanus and COVID-19 vaccines.

## 2021-03-06 NOTE — Patient Instructions (Signed)
Ms.Marvina L Kathlen Mody, it was a pleasure seeing you today!  Today we discussed: - Blood pressure: Your blood pressure looked great today. We will continue with your current regimen.  I have ordered the following labs today:  Lab Orders  No laboratory test(s) ordered today     Tests ordered today:  PAP  Referrals ordered today:   Referral Orders  No referral(s) requested today     I have ordered the following medication/changed the following medications:   Stop the following medications: There are no discontinued medications.   Start the following medications: No orders of the defined types were placed in this encounter.    Follow-up: 6 months   Please make sure to arrive 15 minutes prior to your next appointment. If you arrive late, you may be asked to reschedule.   We look forward to seeing you next time. Please call our clinic at [phone] if you have any questions or concerns. The best time to call is Monday-Friday from 9am-4pm, but there is someone available 24/7. If after hours or the weekend, call the main hospital number and ask for the Internal Medicine Resident On-Call. If you need medication refills, please notify your pharmacy one week in advance and they will send [place] a request.  Thank you for letting us take part in your care. Wishing you the best!  Thank you, Sanjuan Dame, MD

## 2021-03-06 NOTE — Progress Notes (Signed)
   CC: regular follow-up appointment  HPI:  Erika Eaton is a 45 y.o. with medical history as below presenting to [facility] for regular follow-up appointment.  Please see problem-based list for further details, assessments, and plans.  Past Medical History:  Diagnosis Date   Abnormal Pap smear    Herpes simplex    History of pre-eclampsia    Hypertension    not currently taking any medications   Iron deficiency anemia    IV iron infusions   Review of Systems:  As per HPI  Physical Exam:  Vitals:   03/06/21 0843  BP: 129/90  Pulse: 80  Temp: 98.1 F (36.7 C)  TempSrc: Oral  SpO2: 98%  Weight: 218 lb 6.4 oz (99.1 kg)  Height: 5\' 6"  (1.676 m)   General: Sitting comfortably in chair, no acute distress CV: Regular rate, rhythm. No murmurs, rubs, gallops. Warm extremities. Pulm: Normal work of breathing on room air. Clear to auscultation bilaterally.  MSK: Normal bulk, tone. No pitting edema bilaterally. Skin: Warm, dry. No rashes or lesions appreciated. Neuro: Awake, alert, answering questions appropriately. Psych: Normal mood, speech, affect.   Assessment & Plan:   See Encounters Tab for problem based charting.  Patient discussed with Dr.  Jimmye Norman

## 2021-03-08 LAB — CYTOLOGY - PAP
Adequacy: ABSENT
Comment: NEGATIVE
Diagnosis: NEGATIVE
High risk HPV: NEGATIVE

## 2021-03-15 ENCOUNTER — Telehealth: Payer: Self-pay

## 2021-03-15 NOTE — Telephone Encounter (Signed)
Received TC from patient who states she received her TDaP from [facility] on 05/27/2017 and would like this updated in her chart.  RN updated in historical immunizations. SChaplin, RN,BSN

## 2021-03-20 ENCOUNTER — Encounter: Payer: Self-pay | Admitting: *Deleted

## 2021-03-30 ENCOUNTER — Other Ambulatory Visit (HOSPITAL_COMMUNITY): Payer: Self-pay

## 2021-04-11 ENCOUNTER — Ambulatory Visit (INDEPENDENT_AMBULATORY_CARE_PROVIDER_SITE_OTHER): Payer: No Typology Code available for payment source | Admitting: Internal Medicine

## 2021-04-11 VITALS — BP 134/96 | HR 76 | Temp 98.1°F | Ht 66.0 in | Wt 214.4 lb

## 2021-04-11 DIAGNOSIS — R1032 Left lower quadrant pain: Secondary | ICD-10-CM

## 2021-04-11 DIAGNOSIS — R109 Unspecified abdominal pain: Secondary | ICD-10-CM | POA: Diagnosis not present

## 2021-04-11 HISTORY — DX: Unspecified abdominal pain: R10.9

## 2021-04-11 LAB — POCT URINALYSIS DIPSTICK
Bilirubin, UA: NEGATIVE
Glucose, UA: NEGATIVE
Ketones, UA: NEGATIVE
Leukocytes, UA: NEGATIVE
Nitrite, UA: NEGATIVE
Protein, UA: NEGATIVE
Spec Grav, UA: 1.02 (ref 1.010–1.025)
Urobilinogen, UA: 2 E.U./dL — AB
pH, UA: 8.5 — AB (ref 5.0–8.0)

## 2021-04-11 LAB — URINALYSIS, ROUTINE W REFLEX MICROSCOPIC
Bacteria, UA: NONE SEEN
Bilirubin Urine: NEGATIVE
Glucose, UA: NEGATIVE mg/dL
Ketones, ur: NEGATIVE mg/dL
Leukocytes,Ua: NEGATIVE
Nitrite: NEGATIVE
Protein, ur: NEGATIVE mg/dL
Specific Gravity, Urine: 1.009 (ref 1.005–1.030)
pH: 9 — ABNORMAL HIGH (ref 5.0–8.0)

## 2021-04-11 NOTE — Assessment & Plan Note (Signed)
Erika Eaton presents with left-sided abdominal pain that began 1 day ago.  She denies any recent trauma or falls.  She states that the pain radiates from the left side to the umbilical area.  Changes in position do not alleviate or exacerbate the pain.  Bowel movements do not alleviate or exacerbate the pain.  Denies vomiting, constipation, or diarrhea.  Admits to nausea and lack of appetite.  No fever or chills.  Urine dipstick- negative  PLAN: UA and urine culture ordered today to rule out infection. CT scan of abdominal and pelvis- possible suspicion for diverticulitis. Instructed to take Tylenol extra strength for pain relief. Will call patient tomorrow to reassess pain scale. Will prescribe tramadol if patient reports minimal relief from Tylenol

## 2021-04-11 NOTE — Patient Instructions (Signed)
Take Tylenol extra strength 3 times a day for pain  I will you tomorrow  CT scan as soon as possible

## 2021-04-11 NOTE — Progress Notes (Signed)
CC: left sided abd pain  HPI:  Erika Eaton is a 45 y.o. female with a past medical history stated below and presents today for left sided abdominal pan. Pain began last night. Denies any trauma or falls. Describes the pain as a sharp 8/10 pain. She states the pain radiates to her umbilicus area.Took OTC tylenol with minimal relief. Denies any fever or chills. Admits to urinating frequency and urgency. She notices a strong odor to her urine. Denies blood in her urine. She reports associated headache and nausea. Denies vomiting, constipation or diarrhea. Denies vaginal odor, itching or discharge.  Bowel movements do not alleviate or exacerbate the pain.   Please see problem based assessment and plan for additional details.  Past Medical History:  Diagnosis Date   Abnormal Pap smear    Herpes simplex    History of pre-eclampsia    Hypertension    not currently taking any medications   Iron deficiency anemia    IV iron infusions    Current Outpatient Medications on File Prior to Visit  Medication Sig Dispense Refill   acetaminophen (TYLENOL) 500 MG tablet Take 1,000 mg by mouth every 6 (six) hours as needed for moderate pain.     amLODipine (NORVASC) 10 MG tablet TAKE 1 TABLET BY MOUTH ONCE DAILY 90 tablet 3   Blood Pressure Monitoring (OMRON 3 SERIES BP MONITOR) DEVI USE AS DIRECTED 1 each 0   hydrochlorothiazide (HYDRODIURIL) 25 MG tablet TAKE 1 TABLET (25 MG TOTAL) BY MOUTH DAILY. 90 tablet 3   ibuprofen (ADVIL) 800 MG tablet Take 1 tablet (800 mg total) by mouth every 6 (six) hours as needed. 30 tablet 4   No current facility-administered medications on file prior to visit.    Family History  Problem Relation Age of Onset   Hypertension Mother    Hypertension Sister    Hypertension Maternal Grandmother    Asthma Maternal Grandmother    Diabetes Maternal Grandmother     Social History   Socioeconomic History   Marital status: Married    Spouse name: Not on file    Number of children: Not on file   Years of education: Not on file   Highest education level: Not on file  Occupational History   Not on file  Tobacco Use   Smoking status: Never   Smokeless tobacco: Never  Vaping Use   Vaping Use: Never used  Substance and Sexual Activity   Alcohol use: Yes    Alcohol/week: 2.0 standard drinks    Types: 2 Glasses of wine per week    Comment: 2 glasses per week   Drug use: No   Sexual activity: Yes    Birth control/protection: Surgical    Comment: Tubal Lilgation  Other Topics Concern   Not on file  Social History Narrative   Not on file   Social Determinants of Health   Financial Resource Strain: Not on file  Food Insecurity: Not on file  Transportation Needs: Not on file  Physical Activity: Not on file  Stress: Not on file  Social Connections: Not on file  Intimate Partner Violence: Not on file    Review of Systems  Constitutional:  Negative for chills and fever.  Eyes:  Negative for blurred vision and double vision.  Respiratory:  Negative for shortness of breath and wheezing.   Cardiovascular:  Negative for chest pain, palpitations and leg swelling.  Gastrointestinal:  Positive for abdominal pain and nausea. Negative for blood in stool,  constipation, diarrhea, heartburn and vomiting.  Genitourinary:  Positive for frequency and urgency. Negative for dysuria and hematuria.  Musculoskeletal:  Negative for falls.  Neurological:  Positive for headaches. Negative for dizziness.    Vitals:   04/11/21 1403  BP: (!) 134/96  Pulse: 76  Temp: 98.1 F (36.7 C)  TempSrc: Oral  SpO2: 99%  Weight: 214 lb 6.4 oz (97.3 kg)  Height: '5\' 6"'$  (1.676 m)     Physical Exam HENT:     Head: Normocephalic and atraumatic.  Cardiovascular:     Rate and Rhythm: Normal rate and regular rhythm.     Heart sounds: Normal heart sounds, S1 normal and S2 normal.  Pulmonary:     Effort: Pulmonary effort is normal.     Breath sounds: Normal breath  sounds and air entry.  Abdominal:     General: Bowel sounds are normal.     Palpations: Abdomen is soft. There is no shifting dullness or fluid wave.     Tenderness: There is abdominal tenderness in the periumbilical area and left lower quadrant. There is right CVA tenderness and left CVA tenderness. There is no guarding.    Musculoskeletal:     Right lower leg: No edema.     Left lower leg: No edema.  Skin:    General: Skin is warm and dry.  Neurological:     General: No focal deficit present.     Mental Status: She is alert.  Psychiatric:        Attention and Perception: Attention normal.        Mood and Affect: Mood normal.        Behavior: Behavior normal. Behavior is cooperative.     Assessment & Plan:   See Encounters Tab for problem based charting.  Patient seen with Dr. Rachael Fee, M.D. [facility], PGY-1 Pager: [phone], Phone: [phone] Date 04/11/2021 Time 2:38 PM

## 2021-04-11 NOTE — Progress Notes (Signed)
Internal Medicine Clinic Attending  I saw and evaluated the patient.  I personally confirmed the key portions of the history and exam documented by Dr. Jeanice Lim and I reviewed pertinent patient test results.  The assessment, diagnosis, and plan were formulated together and I agree with the documentation in the resident's note.  Decreased appetite and localized constant left abdominal pain with N/V/D/C/F.  Quite tender on exam, hypoactive BS.  I didn't elicit CVAT though she was tender more caudal and lateral than the angle.  Broad differential; given the level of her symptoms, imaging is appropriate.

## 2021-04-12 ENCOUNTER — Telehealth: Payer: Self-pay | Admitting: Internal Medicine

## 2021-04-12 LAB — CBC WITH DIFFERENTIAL/PLATELET
Basophils Absolute: 0 10*3/uL (ref 0.0–0.2)
Basos: 0 %
EOS (ABSOLUTE): 0.1 10*3/uL (ref 0.0–0.4)
Eos: 2 %
Hematocrit: 37.8 % (ref 34.0–46.6)
Hemoglobin: 12.3 g/dL (ref 11.1–15.9)
Immature Grans (Abs): 0 10*3/uL (ref 0.0–0.1)
Immature Granulocytes: 0 %
Lymphocytes Absolute: 1.5 10*3/uL (ref 0.7–3.1)
Lymphs: 27 %
MCH: 26.5 pg — ABNORMAL LOW (ref 26.6–33.0)
MCHC: 32.5 g/dL (ref 31.5–35.7)
MCV: 82 fL (ref 79–97)
Monocytes Absolute: 0.4 10*3/uL (ref 0.1–0.9)
Monocytes: 7 %
Neutrophils Absolute: 3.6 10*3/uL (ref 1.4–7.0)
Neutrophils: 64 %
Platelets: 230 10*3/uL (ref 150–450)
RBC: 4.64 x10E6/uL (ref 3.77–5.28)
RDW: 13.8 % (ref 11.7–15.4)
WBC: 5.6 10*3/uL (ref 3.4–10.8)

## 2021-04-12 LAB — URINE CULTURE

## 2021-04-12 LAB — CMP14 + ANION GAP
ALT: 19 IU/L (ref 0–32)
AST: 23 IU/L (ref 0–40)
Albumin/Globulin Ratio: 1.6 (ref 1.2–2.2)
Albumin: 4.6 g/dL (ref 3.8–4.8)
Alkaline Phosphatase: 49 IU/L (ref 44–121)
Anion Gap: 17 mmol/L (ref 10.0–18.0)
BUN/Creatinine Ratio: 10 (ref 9–23)
BUN: 7 mg/dL (ref 6–24)
Bilirubin Total: 0.4 mg/dL (ref 0.0–1.2)
CO2: 25 mmol/L (ref 20–29)
Calcium: 9.4 mg/dL (ref 8.7–10.2)
Chloride: 96 mmol/L (ref 96–106)
Creatinine, Ser: 0.73 mg/dL (ref 0.57–1.00)
Globulin, Total: 2.9 g/dL (ref 1.5–4.5)
Glucose: 88 mg/dL (ref 65–99)
Potassium: 3.5 mmol/L (ref 3.5–5.2)
Sodium: 138 mmol/L (ref 134–144)
Total Protein: 7.5 g/dL (ref 6.0–8.5)
eGFR: 104 mL/min/{1.73_m2} (ref >59)

## 2021-04-12 NOTE — Telephone Encounter (Signed)
I called Erika Eaton regarding her left-sided abdominal pain. She reports moderate relief with tylenol and heating pad. The pain is still present but not as severe as yesterday, she reports. She has not been contacted regarding scheduling her CT imaging. I advised her to call the office by Friday if she has not been able to obtain an appt.

## 2021-04-12 NOTE — Telephone Encounter (Signed)
I also discussed her UA and urine culture results as well as her lab work. She acknowledges.

## 2021-04-15 ENCOUNTER — Other Ambulatory Visit: Payer: Self-pay

## 2021-04-15 ENCOUNTER — Emergency Department (HOSPITAL_COMMUNITY)
Admission: EM | Admit: 2021-04-15 | Discharge: 2021-04-15 | Disposition: A | Discharge status: Home / Self Care | Payer: No Typology Code available for payment source | Attending: Emergency Medicine | Admitting: Emergency Medicine

## 2021-04-15 ENCOUNTER — Encounter (HOSPITAL_COMMUNITY): Payer: Self-pay | Admitting: Emergency Medicine

## 2021-04-15 ENCOUNTER — Emergency Department (HOSPITAL_COMMUNITY): Payer: No Typology Code available for payment source

## 2021-04-15 DIAGNOSIS — I10 Essential (primary) hypertension: Secondary | ICD-10-CM | POA: Insufficient documentation

## 2021-04-15 DIAGNOSIS — J069 Acute upper respiratory infection, unspecified: Secondary | ICD-10-CM | POA: Insufficient documentation

## 2021-04-15 DIAGNOSIS — J0100 Acute maxillary sinusitis, unspecified: Secondary | ICD-10-CM | POA: Insufficient documentation

## 2021-04-15 DIAGNOSIS — Z20822 Contact with and (suspected) exposure to covid-19: Secondary | ICD-10-CM | POA: Diagnosis not present

## 2021-04-15 DIAGNOSIS — Z79899 Other long term (current) drug therapy: Secondary | ICD-10-CM | POA: Diagnosis not present

## 2021-04-15 DIAGNOSIS — R059 Cough, unspecified: Secondary | ICD-10-CM | POA: Diagnosis present

## 2021-04-15 LAB — RESP PANEL BY RT-PCR (FLU A&B, COVID) ARPGX2
Influenza A by PCR: NEGATIVE
Influenza B by PCR: NEGATIVE
SARS Coronavirus 2 by RT PCR: NEGATIVE

## 2021-04-15 MED ORDER — BENZONATATE 100 MG PO CAPS
100.0000 mg | ORAL_CAPSULE | Freq: Three times a day (TID) | ORAL | 0 refills | Status: DC
  Filled 2021-04-16: qty 21, 7d supply, fill #0

## 2021-04-15 MED ORDER — ACETAMINOPHEN 325 MG PO TABS
650.0000 mg | ORAL_TABLET | Freq: Once | ORAL | Status: AC | PRN
  Administered 2021-04-15: 650 mg via ORAL
  Filled 2021-04-15: qty 2

## 2021-04-15 MED ORDER — AMOXICILLIN-POT CLAVULANATE 875-125 MG PO TABS
1.0000 | ORAL_TABLET | Freq: Two times a day (BID) | ORAL | 0 refills | Status: AC
  Filled 2021-04-16: qty 14, 7d supply, fill #0

## 2021-04-15 MED ORDER — AMOXICILLIN-POT CLAVULANATE 875-125 MG PO TABS
1.0000 | ORAL_TABLET | Freq: Once | ORAL | Status: AC
  Administered 2021-04-15: 1 via ORAL
  Filled 2021-04-15: qty 1

## 2021-04-15 NOTE — ED Provider Notes (Signed)
[place] EMERGENCY DEPARTMENT Provider Note   CSN: [id number] Arrival date & time: 04/15/21  [id number]     History Chief Complaint  Patient presents with   Cough    Erika Eaton is a 45 y.o. female.  45 yo F with a chief complaints of fever and cough.  Going on now for about 5 days.  Her son was sick with a similar illness.  She had a negative COVID test about 48 hours ago.  1 episode of emesis.  Otherwise been able to eat and drink okay.  She does feel like her sense of smell is decreased quite a bit.  Having some sinus pressure and pain to the left ear.  Has some chest pain with coughing.  The history is provided by the patient.  Cough Associated symptoms: ear pain and fever   Associated symptoms: no chest pain, no chills, no headaches, no myalgias, no rhinorrhea, no shortness of breath and no wheezing   Illness Severity:  Moderate Onset quality:  Gradual Duration:  5 days Timing:  Constant Progression:  Worsening Chronicity:  New Associated symptoms: congestion, cough, ear pain, fatigue and fever   Associated symptoms: no chest pain, no headaches, no myalgias, no nausea, no rhinorrhea, no shortness of breath, no vomiting and no wheezing       Past Medical History:  Diagnosis Date   Abnormal Pap smear    Herpes simplex    History of pre-eclampsia    Hypertension    not currently taking any medications   Iron deficiency anemia    IV iron infusions    Patient Active Problem List   Diagnosis Date Noted   left sided Abdominal pain 04/11/2021   Hypertension 11/02/2020   Screening for hypertension 11/02/2020   Healthcare maintenance 11/02/2020   Mild depression ([place]) 11/02/2020   Uterine fibroid 10/21/2019   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    Past Surgical History:  Procedure Laterality Date   ABDOMINAL HYSTERECTOMY     EYE SURGERY     after trauma has a child   ROBOTIC ASSISTED LAPAROSCOPIC HYSTERECTOMY AND SALPINGECTOMY  Bilateral 10/21/2019   Procedure: XI ROBOTIC ASSISTED LAPAROSCOPIC HYSTERECTOMY AND SALPINGECTOMY;  Surgeon: Servando Salina, MD;  Location: [place];  Service: Gynecology;  Laterality: Bilateral;   TUBAL LIGATION     TUBAL LIGATION  06/15/2011   Procedure: POST PARTUM TUBAL LIGATION;  Surgeon: Emeterio Reeve, MD;  Location: [facility];  Service: Gynecology;  Laterality: Bilateral;  bilateral post partum tubal ligation with filshie clips.     OB History     Gravida  4   Para  4   Term  2   Preterm  2   AB      Living  4      SAB      IAB      Ectopic      Multiple      Live Births  1           Family History  Problem Relation Age of Onset   Hypertension Mother    Hypertension Sister    Hypertension Maternal Grandmother    Asthma Maternal Grandmother    Diabetes Maternal Grandmother     Social History   Tobacco Use   Smoking status: Never   Smokeless tobacco: Never  Vaping Use   Vaping Use: Never used  Substance Use Topics   Alcohol use: Yes    Alcohol/week: 2.0 standard drinks  Types: 2 Glasses of wine per week    Comment: 2 glasses per week   Drug use: No    Home Medications Prior to Admission medications   Medication Sig Start Date End Date Taking? Authorizing Provider  amoxicillin-clavulanate (AUGMENTIN) 875-125 MG tablet Take 1 tablet by mouth 2 (two) times daily for 7 days. One po bid x 7 days 04/15/21 04/22/21 Yes Deno Etienne, DO  benzonatate (TESSALON) 100 MG capsule Take 1 capsule (100 mg total) by mouth every 8 (eight) hours. 04/15/21  Yes Deno Etienne, DO  acetaminophen (TYLENOL) 500 MG tablet Take 1,000 mg by mouth every 6 (six) hours as needed for moderate pain.    [provider]  amLODipine (NORVASC) 10 MG tablet TAKE 1 TABLET BY MOUTH ONCE DAILY 12/28/20   Mosetta Anis, MD  Blood Pressure Monitoring (OMRON 3 SERIES BP MONITOR) DEVI USE AS DIRECTED 11/15/20 11/15/21    hydrochlorothiazide (HYDRODIURIL) 25 MG tablet  TAKE 1 TABLET (25 MG TOTAL) BY MOUTH DAILY. 12/28/20   Mosetta Anis, MD  ibuprofen (ADVIL) 800 MG tablet Take 1 tablet (800 mg total) by mouth every 6 (six) hours as needed. 10/22/19   Servando Salina, MD    Allergies    Chocolate, Robaxin [methocarbamol], and Soda lime  Review of Systems   Review of Systems  Constitutional:  Positive for fatigue and fever. Negative for chills.  HENT:  Positive for congestion, ear pain, sinus pressure and sinus pain. Negative for rhinorrhea.   Eyes:  Negative for redness and visual disturbance.  Respiratory:  Positive for cough. Negative for shortness of breath and wheezing.   Cardiovascular:  Negative for chest pain and palpitations.  Gastrointestinal:  Negative for nausea and vomiting.  Genitourinary:  Negative for dysuria and urgency.  Musculoskeletal:  Negative for arthralgias and myalgias.  Skin:  Negative for pallor and wound.  Neurological:  Negative for dizziness and headaches.   Physical Exam Updated Vital Signs BP 119/79   Pulse 93   Temp 99.4 F (37.4 C) (Oral)   Resp 17   LMP 10/08/2019 (Exact Date)   SpO2 94%   Physical Exam Vitals and nursing note reviewed.  Constitutional:      General: She is not in acute distress.    Appearance: She is well-developed. She is not diaphoretic.  HENT:     Head: Normocephalic and atraumatic.     Comments: Swollen turbinates, posterior nasal drip, left maxillary sinuses exquisitely tender to percussion, tm normal bilaterally.   Eyes:     Pupils: Pupils are equal, round, and reactive to light.  Cardiovascular:     Rate and Rhythm: Normal rate and regular rhythm.     Heart sounds: No murmur heard.   No friction rub. No gallop.  Pulmonary:     Effort: Pulmonary effort is normal.     Breath sounds: No wheezing or rales.  Abdominal:     General: There is no distension.     Palpations: Abdomen is soft.     Tenderness: There is no abdominal tenderness.  Musculoskeletal:        General: No  tenderness.     Cervical back: Normal range of motion and neck supple.  Skin:    General: Skin is warm and dry.  Neurological:     Mental Status: She is alert and oriented to person, place, and time.  Psychiatric:        Behavior: Behavior normal.    ED Results / Procedures / Treatments  Labs (all labs ordered are listed, but only abnormal results are displayed) Labs Reviewed  RESP PANEL BY RT-PCR (FLU A&B, COVID) [id number]    EKG EKG Interpretation  Date/Time:  'Sunday April 15 2021 09:01:50 EDT Ventricular Rate:  91 PR Interval:  164 QRS Duration: 92 QT Interval:  352 QTC Calculation: 432 R Axis:   67 Text Interpretation: Normal sinus rhythm Incomplete right bundle branch block Borderline ECG No significant change since last tracing Confirmed by Madelene Kaatz ([id number]) on 04/15/2021 3:04:50 PM  Radiology DG Chest 2 View  Result Date: 04/15/2021 CLINICAL DATA:  Nonproductive cough EXAM: CHEST - 2 VIEW COMPARISON:  10/05/2012 FINDINGS: Midline trachea.  Normal heart size and mediastinal contours. Sharp costophrenic angles.  No pneumothorax.  Clear lungs. IMPRESSION: No active cardiopulmonary disease. Electronically Signed   By: Kyle  Talbot M.D.   On: 04/15/2021 09:42    Procedures Procedures   Medications Ordered in ED Medications  amoxicillin-clavulanate (AUGMENTIN) 875-125 MG per tablet 1 tablet (has no administration in time range)  acetaminophen (TYLENOL) tablet 650 mg (650 mg Oral Given 04/15/21 1204)    ED Course  I have reviewed the triage vital signs and the nursing notes.  Pertinent labs & imaging results that were available during my care of the patient were reviewed by me and considered in my medical decision making (see chart for details).    MDM Rules/Calculators/A&P                           45 yo F with a chief complaints of cough and fever going on for about 5 days now.  Patient clinically has a URI.  Does have some signs of sinusitis with a temperature as  high as 102.5 will start on antibiotics.  We will have her follow-up with her family doctor.  3:42 PM:  I have discussed the diagnosis/risks/treatment options with the patient and believe the pt to be eligible for discharge home to follow-up with PCP. We also discussed returning to the ED immediately if new or worsening sx occur. We discussed the sx which are most concerning (e.g., sudden worsening pain, fever, inability to tolerate by mouth) that necessitate immediate return. Medications administered to the patient during their visit and any new prescriptions provided to the patient are listed below.  Medications given during this visit Medications  amoxicillin-clavulanate (AUGMENTIN) 875-125 MG per tablet 1 tablet (has no administration in time range)  acetaminophen (TYLENOL) tablet 650 mg (650 mg Oral Given 04/15/21 1204)     The patient appears reasonably screen and/or stabilized for discharge and I doubt any other medical condition or other EMC requiring further screening, evaluation, or treatment in the ED at this time prior to discharge.   Final Clinical Impression(s) / ED Diagnoses Final diagnoses:  Viral URI with cough  Acute maxillary sinusitis, recurrence not specified    Rx / DC Orders ED Discharge Orders          Ordered    amoxicillin-clavulanate (AUGMENTIN) 875-125 MG tablet  2 times daily        04/15/21 1539    benzonatate (TESSALON) 100 MG capsule  Every 8 hours        07'$ /31/22 [place], Samariah Hokenson, DO 04/15/21 1542

## 2021-04-15 NOTE — ED Triage Notes (Signed)
Reports non-productive cough since Thursday with L ear pain and chest pain only with coughing.  Negative COVID test on Friday.  Her son was sick as well.  Denies body aches and fever.

## 2021-04-15 NOTE — ED Provider Notes (Signed)
Emergency Medicine Provider Triage Evaluation Note  Erika Eaton , a 45 y.o. female  was evaluated in triage.  Pt complains of cough since Thursday. Pain to chest only when coughing. No SOB. Feels fatigued. Low grade temp here. Test neg for COVID at home on Thursday. Family member sick as well. No emesis, LE edema  Review of Systems  Positive: cough Negative: sob  Physical Exam  BP 122/85 (BP Location: Right Arm)   Pulse (!) 116   Temp 100 F (37.8 C) (Oral)   Resp 19   SpO2 97%  Gen:   Awake, no distress   Resp:  Normal effort  MSK:   Moves extremities without difficulty  Other:  LE without edema  Medical Decision Making  Medically screening exam initiated at 9:00 AM.  Appropriate orders placed.  Erika Eaton was informed that the remainder of the evaluation will be completed by another provider, this initial triage assessment does not replace that evaluation, and the importance of remaining in the ED until their evaluation is complete.  cough   Jayel Inks A, PA-C 04/15/21 [id number]    Pattricia Boss, MD 04/16/21 1326

## 2021-04-15 NOTE — Discharge Instructions (Addendum)
Take tylenol 2 pills 4 times a day and motrin 4 pills 3 times a day.  Drink plenty of fluids.  Return for worsening shortness of breath, headache, confusion. Follow up with your family doctor.   

## 2021-04-16 ENCOUNTER — Other Ambulatory Visit (HOSPITAL_COMMUNITY): Payer: Self-pay

## 2021-05-02 NOTE — Progress Notes (Signed)
Called to speak to pt to f/u on her abdominal pain, the reason for the CT.  No answer.  Noted that she presented to ED with unrelated problems a couple of days after [facility] office visit, and did not mention abdominal or flank pain at that time.  Will cancel CT as problem seems to have resolved.

## 2021-06-05 ENCOUNTER — Ambulatory Visit (INDEPENDENT_AMBULATORY_CARE_PROVIDER_SITE_OTHER): Payer: No Typology Code available for payment source | Admitting: Otolaryngology

## 2021-06-05 ENCOUNTER — Other Ambulatory Visit (HOSPITAL_COMMUNITY): Payer: Self-pay

## 2021-06-05 ENCOUNTER — Other Ambulatory Visit: Payer: Self-pay

## 2021-06-05 DIAGNOSIS — R0683 Snoring: Secondary | ICD-10-CM

## 2021-06-05 DIAGNOSIS — H6123 Impacted cerumen, bilateral: Secondary | ICD-10-CM

## 2021-06-05 DIAGNOSIS — J310 Chronic rhinitis: Secondary | ICD-10-CM

## 2021-06-05 MED ORDER — TRIAMCINOLONE ACETONIDE 55 MCG/ACT NA AERO
2.0000 | INHALATION_SPRAY | Freq: Every day | NASAL | 12 refills | Status: DC
Start: 2021-06-05 — End: 2022-03-29
  Filled 2021-06-05: qty 1, 1d supply, fill #0

## 2021-06-05 NOTE — Progress Notes (Signed)
HPI: Erika Eaton is a 45 y.o. female who presents is referred by her PCP for evaluation of blocked ears and snoring.  She was told that she might have large adenoids that made her snoring worse.  She states that her husband complains about her snoring that her snoring is gotten worse over the past couple of years.  She has gained about 15 pounds over the past 2 years.  She also has blockage of her ears especially when she pushes on the ears.  Sometimes they will open up..  Past Medical History:  Diagnosis Date   Abnormal Pap smear    Herpes simplex    History of pre-eclampsia    Hypertension    not currently taking any medications   Iron deficiency anemia    IV iron infusions   Past Surgical History:  Procedure Laterality Date   ABDOMINAL HYSTERECTOMY     EYE SURGERY     after trauma has a child   ROBOTIC ASSISTED LAPAROSCOPIC HYSTERECTOMY AND SALPINGECTOMY Bilateral 10/21/2019   Procedure: XI ROBOTIC ASSISTED LAPAROSCOPIC HYSTERECTOMY AND SALPINGECTOMY;  Surgeon: Servando Salina, MD;  Location: [facility];  Service: Gynecology;  Laterality: Bilateral;   TUBAL LIGATION     TUBAL LIGATION  06/15/2011   Procedure: POST PARTUM TUBAL LIGATION;  Surgeon: Emeterio Reeve, MD;  Location: [facility];  Service: Gynecology;  Laterality: Bilateral;  bilateral post partum tubal ligation with filshie clips.   Social History   Socioeconomic History   Marital status: Married    Spouse name: Not on file   Number of children: Not on file   Years of education: Not on file   Highest education level: Not on file  Occupational History   Not on file  Tobacco Use   Smoking status: Never   Smokeless tobacco: Never  Vaping Use   Vaping Use: Never used  Substance and Sexual Activity   Alcohol use: Yes    Alcohol/week: 2.0 standard drinks    Types: 2 Glasses of wine per week    Comment: 2 glasses per week   Drug use: No   Sexual activity: Yes    Birth control/protection:  Surgical    Comment: Tubal Lilgation  Other Topics Concern   Not on file  Social History Narrative   Not on file   Social Determinants of Health   Financial Resource Strain: Not on file  Food Insecurity: Not on file  Transportation Needs: Not on file  Physical Activity: Not on file  Stress: Not on file  Social Connections: Not on file   Family History  Problem Relation Age of Onset   Hypertension Mother    Hypertension Sister    Hypertension Maternal Grandmother    Asthma Maternal Grandmother    Diabetes Maternal Grandmother    Allergies  Allergen Reactions   Chocolate Rash   Robaxin [Methocarbamol] Rash   Soda Lime Rash    ALL SODA   Prior to Admission medications   Medication Sig Start Date End Date Taking? Authorizing Provider  acetaminophen (TYLENOL) 500 MG tablet Take 1,000 mg by mouth every 6 (six) hours as needed for moderate pain.    [provider]  amLODipine (NORVASC) 10 MG tablet TAKE 1 TABLET BY MOUTH ONCE DAILY 12/28/20   Mosetta Anis, MD  benzonatate (TESSALON) 100 MG capsule Take 1 capsule (100 mg total) by mouth every 8 (eight) hours. 04/15/21   Deno Etienne, DO  Blood Pressure Monitoring (OMRON 3 SERIES BP MONITOR)  DEVI USE AS DIRECTED 11/15/20 11/15/21    hydrochlorothiazide (HYDRODIURIL) 25 MG tablet TAKE 1 TABLET (25 MG TOTAL) BY MOUTH DAILY. 12/28/20   Mosetta Anis, MD  ibuprofen (ADVIL) 800 MG tablet Take 1 tablet (800 mg total) by mouth every 6 (six) hours as needed. 10/22/19   Servando Salina, MD     Positive ROS: Otherwise negative  All other systems have been reviewed and were otherwise negative with the exception of those mentioned in the HPI and as above.  Physical Exam: Constitutional: Alert, well-appearing, no acute distress Ears: External ears without lesions or tenderness. Ear canals are completely occluded with wax on both sides requiring irrigation as well as suction in order to remove all the cerumen.  After cleaning the cerumen  from both ear canals the TMs were otherwise clear with good mobility on pneumatic otoscopy. Nasal: External nose without lesions. Septum with mild deformity and moderate rhinitis.  After decongesting the nose nasal endoscopy was performed and on nasal endoscopy both middle meatus regions were clear there are no polyps or obstructive lesions noted within the nasal cavity and on examination of the nasopharynx she does not have significant adenoid tissue..  Oral: Lips and gums without lesions. Tongue and palate mucosa without lesions. Posterior oropharynx clear..  Average sized tonsils with elongated uvula.  Of note she has a large tongue base. Neck: No palpable adenopathy or masses Respiratory: Breathing comfortably  Skin: No facial/neck lesions or rash noted.  Cerumen impaction removal  Date/Time: 06/05/2021 5:28 PM Performed by: Rozetta Nunnery, MD Authorized by: Rozetta Nunnery, MD   Consent:    Consent obtained:  Verbal   Consent given by:  Patient Procedure details:    Location:  L ear and R ear   Procedure type: irrigation and suction   Comments:     Both ear canals are completely occluded with cerumen that was removed with irrigation and suction.  TMs were clear.  Assessment: Chronic rhinitis with mild nasal obstruction.  No significant adenoid tissue on nasopharyngoscopy. Snoring with questionable obstructive sleep apnea.  Plan: Ear canals were cleaned in the office today with improved hearing. Recommended obtaining a sleep test to evaluate for obstructive sleep apnea and apparently this is being scheduled through her primary care doctor. Prescribed Nasacort 2 sprays each nostril at night as this will help some with nasal congestion.   Radene Journey, MD   CC:

## 2021-06-06 ENCOUNTER — Other Ambulatory Visit (HOSPITAL_COMMUNITY): Payer: Self-pay

## 2021-07-02 ENCOUNTER — Telehealth: Payer: Self-pay | Admitting: Student

## 2021-07-02 ENCOUNTER — Other Ambulatory Visit (HOSPITAL_COMMUNITY): Payer: Self-pay

## 2021-07-02 NOTE — Telephone Encounter (Signed)
90 with 3 refills sent on 4/14 for both meds. Patient notified to call [facility] for refills.

## 2021-07-02 NOTE — Telephone Encounter (Signed)
Refill Request  amLODipine (NORVASC) 10 MG tablet  hydrochlorothiazide (HYDRODIURIL) 25 MG tablet   [facility] (Ph: [phone]

## 2021-10-01 ENCOUNTER — Other Ambulatory Visit (HOSPITAL_COMMUNITY): Payer: Self-pay

## 2021-10-15 ENCOUNTER — Ambulatory Visit (INDEPENDENT_AMBULATORY_CARE_PROVIDER_SITE_OTHER): Payer: No Typology Code available for payment source | Admitting: Internal Medicine

## 2021-10-15 ENCOUNTER — Other Ambulatory Visit: Payer: Self-pay

## 2021-10-15 ENCOUNTER — Encounter: Payer: Self-pay | Admitting: Internal Medicine

## 2021-10-15 ENCOUNTER — Other Ambulatory Visit: Payer: Self-pay | Admitting: Internal Medicine

## 2021-10-15 VITALS — BP 115/88 | HR 76 | Temp 99.2°F | Resp 20 | Ht 66.0 in | Wt 219.9 lb

## 2021-10-15 DIAGNOSIS — N644 Mastodynia: Secondary | ICD-10-CM | POA: Insufficient documentation

## 2021-10-15 DIAGNOSIS — Z3202 Encounter for pregnancy test, result negative: Secondary | ICD-10-CM | POA: Diagnosis not present

## 2021-10-15 LAB — POCT URINE PREGNANCY: Preg Test, Ur: NEGATIVE

## 2021-10-15 NOTE — Assessment & Plan Note (Addendum)
Patient presents for OV for 1 week history of bilateral breast pain.  She reports pain in both her breasts, feeling of heaviness in her breasts when she gets up in the morning.  She has also had pain and soreness of both breasts when hugging a coworker.  No change in breast size the last few months.  No change in weight the last few months per chart review.  Patient reports no nipple discharge, no overlying skin changes, no lumps or point tenderness that she has identified.  Patient has had hysterectomy and no longer has periods.  She does still have her ovaries in place, reports has had tubes tied in the past.  Is still currently sexually active.  Has had hot flashes in the last few weeks.  Reports has had no changes in her bra type, uses mild to moderate supporting bras.  Tylenol and heat packs have helped with her breast pain.  Patient does have history of breast cancer in her cousin on maternal side, and great aunt on the maternal side.  Has never had a mammogram.  On exam, breasts are symmetric, pendulous, soft without nodules or masses palpated.  Nipple areolar complexes brown, everted nipples, without discharge.  Overlying skin warm and dry, without change in texture, without erythema.  Patient had diffuse tenderness to palpation within a 4 cm diameter of bilateral areola, at the most dependent portion of bilateral breasts with patient seated.  On assessment, her exam is reassuring and that there were no nodules or masses palpated, no signs of infection, no nipple drainage.  Bilateral breast pain suggests etiology related to hormonal changes or due to musculoskeletal pain in pendulous breasts without proper support.  Given the patient is sexually active and still has her ovaries, will order urine pregnancy test to rule out ectopic pregnancy.  Patient does have hot flashes and therefore breast pain could be related to hormonal changes as patient nears menopause.  If breast pain is related to lack of  support, patient was encouraged to take Tylenol, NSAIDs and try a sports bra for additional support.  Patient is asking for referral for mammogram; I think it is reasonable to do a diagnostic mammogram given her symptoms of pain and positive family history of breast cancer.  Plan: -Ambulatory referral for diagnostic breast US bilateral -Urine pregnancy test -OTC Tylenol, ibuprofen as needed -Sports bra or other bra with more support -Return as needed for breast pain or if symptoms do not improve  ADDENDUM: Pregnancy test negative, patient called and updated.

## 2021-10-15 NOTE — Progress Notes (Signed)
°  CC: breast pain  HPI:  [name] is a 46 y.o. female with a past medical history stated below and presents today for breast pain. Please see problem based assessment and plan for additional details.  Past Medical History:  Diagnosis Date   Abnormal Pap smear    Herpes simplex    History of pre-eclampsia    Hypertension    not currently taking any medications   Iron deficiency anemia    IV iron infusions    Current Outpatient Medications on File Prior to Visit  Medication Sig Dispense Refill   acetaminophen (TYLENOL) 500 MG tablet Take 1,000 mg by mouth every 6 (six) hours as needed for moderate pain.     amLODipine (NORVASC) 10 MG tablet TAKE 1 TABLET BY MOUTH ONCE DAILY 90 tablet 3   benzonatate (TESSALON) 100 MG capsule Take 1 capsule (100 mg total) by mouth every 8 (eight) hours. 21 capsule 0   Blood Pressure Monitoring (OMRON 3 SERIES BP MONITOR) DEVI USE AS DIRECTED 1 each 0   hydrochlorothiazide (HYDRODIURIL) 25 MG tablet TAKE 1 TABLET (25 MG TOTAL) BY MOUTH DAILY. 90 tablet 3   ibuprofen (ADVIL) 800 MG tablet Take 1 tablet (800 mg total) by mouth every 6 (six) hours as needed. 30 tablet 4   triamcinolone (NASACORT) 55 MCG/ACT AERO nasal inhaler Place 2 sprays into the nose daily. 2 sprays each nostril at night 1 each 12   No current facility-administered medications on file prior to visit.    Family History  Problem Relation Age of Onset   Hypertension Mother    Hypertension Sister    Hypertension Maternal Grandmother    Asthma Maternal Grandmother    Diabetes Maternal Grandmother    Breast cancer Cousin    Breast cancer Other     Review of Systems: ROS negative except for what is noted on the assessment and plan.  Vitals:   10/15/21 1041  BP: 115/88  Pulse: 76  Resp: 20  Temp: 99.2 F (37.3 C)  TempSrc: Oral  SpO2: 99%  Weight: 219 lb 14.4 oz (99.7 kg)  Height: 5\' 6"  (1.676 m)     Physical Exam: General: Well appearing obese african  [place] female, NAD HENT: normocephalic, atraumatic EYES: conjunctiva non-erythematous, no scleral icterus CV: regular rate, normal rhythm, no murmurs, rubs, gallops. Pulmonary: normal work of breathing on RA, lungs clear to auscultation, no rales, wheezes, rhonchi Abdominal: non-distended, soft, non-tender to palpation, normal BS Skin: Warm and dry, no rashes or lesions Neurological: MS: awake, alert and oriented x3, normal speech and fund of knowledge Motor: moves all extremities antigravity Psych: normal affect Breast Exam: Breasts are symmetric, pendulous, soft without nodules or masses palpated.  Nipple areolar complexes brown, everted nipples, without discharge.  Overlying skin warm and dry, without change in texture, without erythema.  Patient had diffuse tenderness to palpation within a 4 cm diameter of bilateral areola, at the most dependent portion of bilateral breasts with patient seated.    Assessment & Plan:   See Encounters Tab for problem based charting.  Patient discussed with Dr. Georgana Curio, M.D. [facility], PGY-1 Pager: [phone] Date 10/15/2021 Time 12:05 PM

## 2021-10-15 NOTE — Patient Instructions (Addendum)
Thank you, Ms.Elita Quick for allowing [place] to provide your care today. Today we discussed:  Breast pain: Your breast exam today was reassuring and that we did not see any redness or concerns for infection, and we did not see any nodules concerning for tumor.  I do think it is still a good idea to do a urine pregnancy test today since he still have your ovaries, as well as a referral for diagnostic mammogram. Try Sports bra and tylenol, ibuprofen.  I have ordered the following labs for you:   Lab Orders         POCT Urine Pregnancy      My Chart Access: [email or web address]  Please follow-up as needed for breast pain. If you need an appointment for any reason please do not hesitate to call our office and they will try to make an appointment for you soon as possible.  Please make sure to arrive 15 minutes prior to your next appointment. If you arrive late, you may be asked to reschedule.    We look forward to seeing you next time. Please call our clinic at [phone] if you have any questions or concerns. The best time to call is Monday-Friday from 9am-4pm, but there is someone available 24/7. If after hours or the weekend, call the main hospital number and ask for the Internal Medicine Resident On-Call. If you need medication refills, please notify your pharmacy one week in advance and they will send [place] a request.   Thank you for letting us take part in your care. Wishing you the best!  Wayland Denis, MD 10/15/2021, 11:58 AM IM Resident, PGY-1

## 2021-10-16 ENCOUNTER — Telehealth: Payer: Self-pay | Admitting: *Deleted

## 2021-10-16 NOTE — Telephone Encounter (Signed)
Call to patient message left message to call Clinics about appointment.   Patient is scheduled for a Diagnostic Mammogram for  11/13/2021 at 8:20 AM to arrive by 8:00 AM.  At the [facility] [address].  There will be a $75.00 charge for missed appointments.  Sander Nephew, RN 10/16/2021 1:35 PM.

## 2021-10-16 NOTE — Progress Notes (Signed)
Internal Medicine Clinic Attending ? ?Case discussed with Dr. Zinoviev  At the time of the visit.  We reviewed the resident?s history and exam and pertinent patient test results.  I agree with the assessment, diagnosis, and plan of care documented in the resident?s note.  ?

## 2021-11-13 ENCOUNTER — Ambulatory Visit
Admission: RE | Admit: 2021-11-13 | Discharge: 2021-11-13 | Disposition: A | Discharge status: Home / Self Care | Payer: No Typology Code available for payment source | Source: Ambulatory Visit | Attending: Internal Medicine | Admitting: Internal Medicine

## 2021-11-13 ENCOUNTER — Ambulatory Visit: Payer: No Typology Code available for payment source

## 2021-11-13 DIAGNOSIS — N644 Mastodynia: Secondary | ICD-10-CM

## 2022-01-14 ENCOUNTER — Other Ambulatory Visit: Payer: Self-pay | Admitting: Internal Medicine

## 2022-01-14 ENCOUNTER — Other Ambulatory Visit (HOSPITAL_COMMUNITY): Payer: Self-pay

## 2022-01-14 DIAGNOSIS — I10 Essential (primary) hypertension: Secondary | ICD-10-CM

## 2022-01-14 MED ORDER — HYDROCHLOROTHIAZIDE 25 MG PO TABS
25.0000 mg | ORAL_TABLET | Freq: Every day | ORAL | 3 refills | Status: DC
  Filled 2022-01-14: qty 90, 90d supply, fill #0
  Filled 2022-04-22: qty 90, 90d supply, fill #1
  Filled 2022-08-02: qty 90, 90d supply, fill #2
  Filled 2022-11-07: qty 90, 90d supply, fill #3

## 2022-01-14 MED ORDER — AMLODIPINE BESYLATE 10 MG PO TABS
10.0000 mg | ORAL_TABLET | Freq: Every day | ORAL | 3 refills | Status: DC
  Filled 2022-01-14: qty 90, 90d supply, fill #0
  Filled 2022-04-22: qty 90, 90d supply, fill #1
  Filled 2022-08-02 (×2): qty 90, 90d supply, fill #2
  Filled 2022-11-07: qty 90, 90d supply, fill #3

## 2022-01-15 ENCOUNTER — Other Ambulatory Visit (HOSPITAL_COMMUNITY): Payer: Self-pay

## 2022-03-07 ENCOUNTER — Telehealth: Payer: Self-pay | Admitting: Student

## 2022-03-07 NOTE — Telephone Encounter (Signed)
Pt calling back to f/u with her sleep study order that was order back in 2022 @ [facility].    The pt is instead requesting a Sleep Study Referral to be sent instead.  Please advise if a Referral can be placed.

## 2022-03-27 NOTE — Progress Notes (Signed)
03/29/22- 69 yoF never smoker self-referred for sleep evaluation with concern of snoring Medical problem list includes HTN, FE Anemia,  Epworth score-3 Body weight today-216 lbs Covid vax-2 Phizer -----Patient states that husband has told her that she snores and stops breathing when sleeping. She going to sleep and staying asleep. Wakes up 2-3 times a night. Patient feels very fatigued and has headaches at night and when she wakes up. Husband complains about her snoring which disturbs his sleep.  No ENT surgery and told by ENT that exam was okay.  No sleep medicines.  1 cup morning coffee and sometimes an energy drink.  No naps. Working as a Theatre stage manager.  Grandmother wears CPAP.  Prior to Admission medications   Medication Sig Start Date End Date Taking? Authorizing Provider  acetaminophen (TYLENOL) 500 MG tablet Take 1,000 mg by mouth every 6 (six) hours as needed for moderate pain.   Yes [provider]  amLODipine (NORVASC) 10 MG tablet TAKE 1 TABLET BY MOUTH ONCE DAILY 01/14/22  Yes Gaylan Gerold, DO  hydrochlorothiazide (HYDRODIURIL) 25 MG tablet TAKE 1 TABLET (25 MG TOTAL) BY MOUTH DAILY. 01/14/22  Yes Gaylan Gerold, DO  ibuprofen (ADVIL) 800 MG tablet Take 1 tablet (800 mg total) by mouth every 6 (six) hours as needed. 10/22/19  Yes Servando Salina, MD   Past Medical History:  Diagnosis Date   Abnormal Pap smear    Herpes simplex    History of pre-eclampsia    Hypertension    not currently taking any medications   Iron deficiency anemia    IV iron infusions   Psh Family History  Problem Relation Age of Onset   Hypertension Mother    Hypertension Sister    Hypertension Maternal Grandmother    Asthma Maternal Grandmother    Diabetes Maternal Grandmother    Breast cancer Cousin    Breast cancer Other    Social History   Socioeconomic History   Marital status: Married    Spouse name: Not on file   Number of children: Not on file   Years of education: Not on file    Highest education level: Not on file  Occupational History   Not on file  Tobacco Use   Smoking status: Never   Smokeless tobacco: Never  Vaping Use   Vaping Use: Never used  Substance and Sexual Activity   Alcohol use: Yes    Alcohol/week: 2.0 standard drinks of alcohol    Types: 2 Glasses of wine per week    Comment: 2 glasses per week   Drug use: No   Sexual activity: Yes    Birth control/protection: Surgical    Comment: Tubal Lilgation  Other Topics Concern   Not on file  Social History Narrative   Not on file   Social Determinants of Health   Financial Resource Strain: Not on file  Food Insecurity: Not on file  Transportation Needs: Not on file  Physical Activity: Not on file  Stress: Not on file  Social Connections: Not on file  Intimate Partner Violence: Not on file   ROS-see HPI   + = positive Constitutional:    weight loss, night sweats, fevers, chills, +fatigue, lassitude. HEENT:    headaches, difficulty swallowing, tooth/dental problems, sore throat,       sneezing, itching, ear ache, nasal congestion, post nasal drip, snoring CV:    chest pain, orthopnea, PND, swelling in lower extremities, anasarca,      dizziness, palpitations Resp:  shortness of breath with exertion or at rest.                productive cough,   non-productive cough, coughing up of blood.              change in color of mucus.  wheezing.   Skin:    rash or lesions. GI:  No-   heartburn, indigestion, abdominal pain, nausea, vomiting, diarrhea,                 change in bowel habits, loss of appetite GU: dysuria, change in color of urine, no urgency or frequency.   flank pain. MS:   joint pain, stiffness, decreased range of motion, back pain. Neuro-     nothing unusual Psych:  change in mood or affect.  depression or anxiety.   memory loss.  OBJ- Physical Exam      +frequent yawning General- Alert, Oriented, Affect-appropriate, Distress- none acute, + obese Skin- rash-none, lesions-  none, excoriation- none Lymphadenopathy- none Head- atraumatic            Eyes- Gross vision intact, PERRLA, conjunctivae and secretions clear            Ears- Hearing, canals-normal            Nose- Clear, no-Septal dev, mucus, polyps, erosion, perforation             Throat- Mallampati IV , mucosa clear , drainage- none, tonsils- atrophic, +teeth Neck- flexible , trachea midline, no stridor , thyroid nl, carotid no bruit Chest - symmetrical excursion , unlabored           Heart/CV- RRR , no murmur , no gallop  , no rub, nl s1 s2                           - JVD- none , edema- none, stasis changes- none, varices- none           Lung- clear to P&A, wheeze- none, cough- none , dullness-none, rub- none           Chest wall-  Abd-  Br/ Gen/ Rectal- Not done, not indicated Extrem- cyanosis- none, clubbing, none, atrophy- none, strength- nl Neuro- grossly intact to observation

## 2022-03-29 ENCOUNTER — Ambulatory Visit (INDEPENDENT_AMBULATORY_CARE_PROVIDER_SITE_OTHER): Payer: No Typology Code available for payment source | Admitting: Internal Medicine

## 2022-03-29 ENCOUNTER — Encounter: Payer: Self-pay | Admitting: Internal Medicine

## 2022-03-29 VITALS — BP 112/82 | HR 78 | Temp 98.4°F | Ht 66.0 in | Wt 216.8 lb

## 2022-03-29 DIAGNOSIS — R0683 Snoring: Secondary | ICD-10-CM | POA: Diagnosis not present

## 2022-03-29 DIAGNOSIS — G4733 Obstructive sleep apnea (adult) (pediatric): Secondary | ICD-10-CM | POA: Insufficient documentation

## 2022-03-29 NOTE — Patient Instructions (Signed)
Order- schedule home sleep test    dx snoring  Please call us for results and recommendations about 2 weeks after your test

## 2022-03-29 NOTE — Assessment & Plan Note (Signed)
Recommend diet/exercise with goal of normalizing body weight

## 2022-03-29 NOTE — Assessment & Plan Note (Signed)
High probability for OSA.  Appropriate discussion and education done.  Driving safety emphasized. Plan-schedule sleep study then most likely CPAP will be the best initial therapy if appropriate

## 2022-04-22 ENCOUNTER — Other Ambulatory Visit (HOSPITAL_COMMUNITY): Payer: Self-pay

## 2022-05-10 ENCOUNTER — Encounter: Payer: Self-pay | Admitting: Internal Medicine

## 2022-05-24 ENCOUNTER — Ambulatory Visit: Payer: No Typology Code available for payment source

## 2022-05-24 DIAGNOSIS — G4733 Obstructive sleep apnea (adult) (pediatric): Secondary | ICD-10-CM | POA: Diagnosis not present

## 2022-05-24 DIAGNOSIS — R0683 Snoring: Secondary | ICD-10-CM

## 2022-05-31 DIAGNOSIS — G4733 Obstructive sleep apnea (adult) (pediatric): Secondary | ICD-10-CM | POA: Diagnosis not present

## 2022-06-07 ENCOUNTER — Telehealth: Payer: Self-pay | Admitting: Internal Medicine

## 2022-06-07 DIAGNOSIS — G4733 Obstructive sleep apnea (adult) (pediatric): Secondary | ICD-10-CM

## 2022-06-07 NOTE — Telephone Encounter (Signed)
Patient is wanting results of HST that was done earlier this month  Please advise sir

## 2022-06-07 NOTE — Telephone Encounter (Signed)
Patient calling to get results of Hst done on 9/8. Please advise.

## 2022-06-07 NOTE — Telephone Encounter (Signed)
Home Sleep Test showed average of 15 apneas/ hour  Order- new DME, neww CPAP auto 5-15, mask of choice, humidifier, supplies, AirView/ card  She will need ov in 31-90 days after getting her machine, peer insurance regs.

## 2022-06-07 NOTE — Telephone Encounter (Signed)
Called and spoke with pt letting her know the results of sleep study and recs per CY and she verbalized understanding. Order for cpap start has been placed. Told pt to call office once she received her cpap to get f/u scheduled. Nothing further needed.

## 2022-06-07 NOTE — Telephone Encounter (Signed)
Called and left voicemail for patient to call office back for sleep study results.

## 2022-06-07 NOTE — Telephone Encounter (Signed)
Patient is returning phone call. Patient phone number is [phone].

## 2022-08-02 ENCOUNTER — Other Ambulatory Visit (HOSPITAL_COMMUNITY): Payer: Self-pay

## 2022-10-03 DIAGNOSIS — G4733 Obstructive sleep apnea (adult) (pediatric): Secondary | ICD-10-CM | POA: Diagnosis not present

## 2022-10-04 DIAGNOSIS — G4733 Obstructive sleep apnea (adult) (pediatric): Secondary | ICD-10-CM | POA: Diagnosis not present

## 2022-11-03 DIAGNOSIS — G4733 Obstructive sleep apnea (adult) (pediatric): Secondary | ICD-10-CM | POA: Diagnosis not present

## 2022-11-04 DIAGNOSIS — G4733 Obstructive sleep apnea (adult) (pediatric): Secondary | ICD-10-CM | POA: Diagnosis not present

## 2022-11-07 ENCOUNTER — Other Ambulatory Visit (HOSPITAL_COMMUNITY): Payer: Self-pay

## 2022-12-02 DIAGNOSIS — G4733 Obstructive sleep apnea (adult) (pediatric): Secondary | ICD-10-CM | POA: Diagnosis not present

## 2022-12-03 DIAGNOSIS — G4733 Obstructive sleep apnea (adult) (pediatric): Secondary | ICD-10-CM | POA: Diagnosis not present

## 2022-12-26 ENCOUNTER — Other Ambulatory Visit: Payer: Self-pay | Admitting: Internal Medicine

## 2022-12-26 DIAGNOSIS — Z1231 Encounter for screening mammogram for malignant neoplasm of breast: Secondary | ICD-10-CM

## 2023-01-02 DIAGNOSIS — G4733 Obstructive sleep apnea (adult) (pediatric): Secondary | ICD-10-CM | POA: Diagnosis not present

## 2023-01-03 DIAGNOSIS — G4733 Obstructive sleep apnea (adult) (pediatric): Secondary | ICD-10-CM | POA: Diagnosis not present

## 2023-01-30 ENCOUNTER — Other Ambulatory Visit: Payer: Self-pay | Admitting: Student

## 2023-01-30 DIAGNOSIS — I10 Essential (primary) hypertension: Secondary | ICD-10-CM

## 2023-01-31 ENCOUNTER — Other Ambulatory Visit (HOSPITAL_COMMUNITY): Payer: Self-pay

## 2023-01-31 MED ORDER — HYDROCHLOROTHIAZIDE 25 MG PO TABS
25.0000 mg | ORAL_TABLET | Freq: Every day | ORAL | 3 refills | Status: DC
Start: 2023-01-31 — End: 2023-05-13
  Filled 2023-01-31: qty 90, 90d supply, fill #0
  Filled 2023-05-04: qty 90, 90d supply, fill #1

## 2023-01-31 MED ORDER — AMLODIPINE BESYLATE 10 MG PO TABS
10.0000 mg | ORAL_TABLET | Freq: Every day | ORAL | 3 refills | Status: DC
  Filled 2023-01-31: qty 90, 90d supply, fill #0
  Filled 2023-05-04: qty 90, 90d supply, fill #1
  Filled 2023-07-31: qty 90, 90d supply, fill #2
  Filled 2023-11-18: qty 90, 90d supply, fill #3

## 2023-02-01 DIAGNOSIS — G4733 Obstructive sleep apnea (adult) (pediatric): Secondary | ICD-10-CM | POA: Diagnosis not present

## 2023-02-02 DIAGNOSIS — G4733 Obstructive sleep apnea (adult) (pediatric): Secondary | ICD-10-CM | POA: Diagnosis not present

## 2023-02-10 ENCOUNTER — Encounter: Payer: Self-pay | Admitting: *Deleted

## 2023-03-04 DIAGNOSIS — G4733 Obstructive sleep apnea (adult) (pediatric): Secondary | ICD-10-CM | POA: Diagnosis not present

## 2023-03-05 DIAGNOSIS — G4733 Obstructive sleep apnea (adult) (pediatric): Secondary | ICD-10-CM | POA: Diagnosis not present

## 2023-04-03 DIAGNOSIS — G4733 Obstructive sleep apnea (adult) (pediatric): Secondary | ICD-10-CM | POA: Diagnosis not present

## 2023-04-08 DIAGNOSIS — G4733 Obstructive sleep apnea (adult) (pediatric): Secondary | ICD-10-CM | POA: Diagnosis not present

## 2023-05-04 DIAGNOSIS — G4733 Obstructive sleep apnea (adult) (pediatric): Secondary | ICD-10-CM | POA: Diagnosis not present

## 2023-05-09 DIAGNOSIS — G4733 Obstructive sleep apnea (adult) (pediatric): Secondary | ICD-10-CM | POA: Diagnosis not present

## 2023-05-12 ENCOUNTER — Ambulatory Visit (INDEPENDENT_AMBULATORY_CARE_PROVIDER_SITE_OTHER): Payer: 59 | Admitting: Internal Medicine

## 2023-05-12 ENCOUNTER — Encounter: Payer: Self-pay | Admitting: Internal Medicine

## 2023-05-12 ENCOUNTER — Ambulatory Visit (HOSPITAL_COMMUNITY)
Admission: RE | Admit: 2023-05-12 | Discharge: 2023-05-12 | Disposition: A | Discharge status: Home / Self Care | Payer: 59 | Source: Ambulatory Visit | Attending: Internal Medicine | Admitting: Internal Medicine

## 2023-05-12 VITALS — BP 117/76 | HR 84 | Temp 97.8°F | Ht 66.0 in

## 2023-05-12 DIAGNOSIS — E876 Hypokalemia: Secondary | ICD-10-CM

## 2023-05-12 DIAGNOSIS — R42 Dizziness and giddiness: Secondary | ICD-10-CM

## 2023-05-12 DIAGNOSIS — Z1159 Encounter for screening for other viral diseases: Secondary | ICD-10-CM

## 2023-05-12 DIAGNOSIS — R319 Hematuria, unspecified: Secondary | ICD-10-CM | POA: Diagnosis not present

## 2023-05-12 DIAGNOSIS — G4733 Obstructive sleep apnea (adult) (pediatric): Secondary | ICD-10-CM

## 2023-05-12 DIAGNOSIS — Z131 Encounter for screening for diabetes mellitus: Secondary | ICD-10-CM | POA: Diagnosis not present

## 2023-05-12 DIAGNOSIS — N644 Mastodynia: Secondary | ICD-10-CM | POA: Diagnosis not present

## 2023-05-12 DIAGNOSIS — I10 Essential (primary) hypertension: Secondary | ICD-10-CM

## 2023-05-12 DIAGNOSIS — Z13220 Encounter for screening for lipoid disorders: Secondary | ICD-10-CM | POA: Diagnosis not present

## 2023-05-12 DIAGNOSIS — Z0000 Encounter for general adult medical examination without abnormal findings: Secondary | ICD-10-CM

## 2023-05-12 NOTE — Progress Notes (Unsigned)
CC: Physical   HPI:  [name] is a 47 y.o. with medical history of HTN, OSA presenting to [facility] for annual physical.   Please see problem-based list for further details, assessments, and plans.  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   Current Outpatient Medications (Analgesics):    acetaminophen (TYLENOL) 500 MG tablet, Take 1,000 mg by mouth every 6 (six) hours as needed for moderate pain.   ibuprofen (ADVIL) 800 MG tablet, Take 1 tablet (800 mg total) by mouth every 6 (six) hours as needed.    Review of Systems:  Review of system negative unless stated in the problem list or HPI.    Physical Exam:  Vitals:   05/12/23 1449  BP: 117/76  Pulse: 84  Temp: 97.8 F (36.6 C)  TempSrc: Oral  SpO2: 98%  Height: 5\' 6"  (1.676 m)   Physical Exam General: NAD HENT: NCAT Lungs: CTAB, no wheeze, rhonchi or rales.  Cardiovascular: Normal heart sounds, no r/m/g, 2+ pulses in all extremities. No LE edema Abdomen: No TTP, normal bowel sounds MSK: No asymmetry or muscle atrophy.  Skin: no lesions noted on exposed skin Neuro: Alert and oriented x4. CN grossly intact Psych: Normal mood and normal affect   Assessment & Plan:   Breast pain Resolved. Has not been an issue for over an year. Last mammogram without any concerning findings.   Hypertension Pt has HTN and is well controlled here. She reports good blood pressure readings at home with readings in 120s-130s/60s at home. She is interested in stopping  hydrochlorothiazide. I believe this is safe to do so advised pt to continue only amlodipine. If needed we can start an ARB.   Obstructive sleep apnea Moderate sleep apnea AHI 15. Wears it 5 hours about 3/7 days. Advised increasing adherence as it will help with better blood pressure control and weight loss efforts. Pt is understanding.   Healthcare maintenance Lipid panel, hep C and A1c performed.   Dizziness Pt with intermittent symptoms of dizziness/lightheadedness. She states she had 3 episodes in 2024. She gets a warm feeling prior and slight blurriness of vision. This resolved quickly. Pt states she has not had a syncopal episode. Denies any chest pain, headaches or other symptoms. Suspect either orthostatic secondary to diuretic use and noted low Bps intermittently vs vasovagal given description of warmth prior to the lightheadedness. Will dc hydrochlorothiazide and restart ARB if needed. Advised to continue monitoring further episodes after discontinuation of hydrochlorothiazide. ECG was obtained to rule out any heart block and her ECG was unchanged from prior.   Hematuria Noted on chart review. Previous UA with 6-10 RBC per HPF. Will repeat to ensure this is not persistent. UA ordered.   Hypokalemia Pt's lab work shows K at 3.1. She has mild hypokalemia. We are discontinuing her hydrochlorothiazide which is a contributing factor. Advised to eat a well balanced diet. I believe we don't need to supplement the hypokalemia as we are eliminating the source of  K losses.    See Encounters Tab for problem based charting.  Patient Discussed with Dr. Bryson Corona, MD Eligha Bridegroom. [facility], PGY-3

## 2023-05-12 NOTE — Patient Instructions (Addendum)
Erika Eaton, it was a pleasure seeing you today! You endorsed feeling well today. Below are some of the things we talked about this visit. We look forward to seeing you in the follow up appointment!  Today we discussed: For your blood pressure, we can stop the hydrochlorothiazide and continue amlodipine. If your readings at home are >140/80, please let us know as we can restart another medicine such as losartan.  Increase wearing your CPAP every night.  We will get some lab work today.   Monitor your episodes of dizziness and keep a diary of them.   I have ordered the following labs today:   Lab Orders         BMP8+Anion Gap         Lipid Profile         Hemoglobin A1c         Hepatitis C Ab reflex to Quant PCR       Referrals ordered today:   Referral Orders  No referral(s) requested today     I have ordered the following medication/changed the following medications:   Stop the following medications: There are no discontinued medications.   Start the following medications: No orders of the defined types were placed in this encounter.    Follow-up: 6 month follow up   Please make sure to arrive 15 minutes prior to your next appointment. If you arrive late, you may be asked to reschedule.   We look forward to seeing you next time. Please call our clinic at [phone] if you have any questions or concerns. The best time to call is Monday-Friday from 9am-4pm, but there is someone available 24/7. If after hours or the weekend, call the main hospital number and ask for the Internal Medicine Resident On-Call. If you need medication refills, please notify your pharmacy one week in advance and they will send [place] a request.  Thank you for letting us take part in your care. Wishing you the best!  Thank you, Gwenevere Abbot, MD

## 2023-05-12 NOTE — Assessment & Plan Note (Signed)
Resolved. Has not been an issue for over an year. Last mammogram without any concerning findings.

## 2023-05-13 DIAGNOSIS — R319 Hematuria, unspecified: Secondary | ICD-10-CM | POA: Insufficient documentation

## 2023-05-13 DIAGNOSIS — R42 Dizziness and giddiness: Secondary | ICD-10-CM | POA: Insufficient documentation

## 2023-05-13 DIAGNOSIS — E876 Hypokalemia: Secondary | ICD-10-CM | POA: Insufficient documentation

## 2023-05-13 LAB — BMP8+ANION GAP
Anion Gap: 17 mmol/L (ref 10.0–18.0)
BUN/Creatinine Ratio: 14 (ref 9–23)
BUN: 12 mg/dL (ref 6–24)
CO2: 26 mmol/L (ref 20–29)
Calcium: 9.4 mg/dL (ref 8.7–10.2)
Chloride: 99 mmol/L (ref 96–106)
Creatinine, Ser: 0.84 mg/dL (ref 0.57–1.00)
Glucose: 104 mg/dL — ABNORMAL HIGH (ref 70–99)
Potassium: 3.1 mmol/L — ABNORMAL LOW (ref 3.5–5.2)
Sodium: 142 mmol/L (ref 134–144)
eGFR: 87 mL/min/{1.73_m2} (ref >59)

## 2023-05-13 LAB — LIPID PANEL
Chol/HDL Ratio: 3.9 ratio (ref 0.0–4.4)
Cholesterol, Total: 183 mg/dL (ref 100–199)
HDL: 47 mg/dL (ref >39)
LDL Chol Calc (NIH): 119 mg/dL — ABNORMAL HIGH (ref 0–99)
Triglycerides: 93 mg/dL (ref 0–149)
VLDL Cholesterol Cal: 17 mg/dL (ref 5–40)

## 2023-05-13 LAB — HCV INTERPRETATION

## 2023-05-13 LAB — HEMOGLOBIN A1C
Est. average glucose Bld gHb Est-mCnc: 126 mg/dL
Hgb A1c MFr Bld: 6 % — ABNORMAL HIGH (ref 4.8–5.6)

## 2023-05-13 LAB — HCV AB W REFLEX TO QUANT PCR: HCV Ab: NONREACTIVE

## 2023-05-13 NOTE — Assessment & Plan Note (Signed)
Pt's lab work shows K at 3.1. She has mild hypokalemia. We are discontinuing her hydrochlorothiazide which is a contributing factor. Advised to eat a well balanced diet. I believe we don't need to supplement the hypokalemia as we are eliminating the source of K losses.

## 2023-05-13 NOTE — Assessment & Plan Note (Signed)
Noted on chart review. Previous UA with 6-10 RBC per HPF. Will repeat to ensure this is not persistent. UA ordered.

## 2023-05-13 NOTE — Assessment & Plan Note (Signed)
Lipid panel, hep C and A1c performed.

## 2023-05-13 NOTE — Assessment & Plan Note (Signed)
Moderate sleep apnea AHI 15. Wears it 5 hours about 3/7 days. Advised increasing adherence as it will help with better blood pressure control and weight loss efforts. Pt is understanding.

## 2023-05-13 NOTE — Assessment & Plan Note (Signed)
Pt has HTN and is well controlled here. She reports good blood pressure readings at home with readings in 120s-130s/60s at home. She is interested in stopping hydrochlorothiazide. I believe this is safe to do so advised pt to continue only amlodipine. If needed we can start an ARB.

## 2023-05-13 NOTE — Assessment & Plan Note (Signed)
Pt with intermittent symptoms of dizziness/lightheadedness. She states she had 3 episodes in 2024. She gets a warm feeling prior and slight blurriness of vision. This resolved quickly. Pt states she has not had a syncopal episode. Denies any chest pain, headaches or other symptoms. Suspect either orthostatic secondary to diuretic use and noted low Bps intermittently vs vasovagal given description of warmth prior to the lightheadedness. Will dc hydrochlorothiazide and restart ARB if needed. Advised to continue monitoring further episodes after discontinuation of hydrochlorothiazide. ECG was obtained to rule out any heart block and her ECG was unchanged from prior.

## 2023-05-15 NOTE — Progress Notes (Signed)
Internal Medicine Clinic Attending  Case discussed with the resident at the time of the visit.  We reviewed the resident's history and exam and pertinent patient test results.  I agree with the assessment, diagnosis, and plan of care documented in the resident's note.  

## 2023-06-04 DIAGNOSIS — G4733 Obstructive sleep apnea (adult) (pediatric): Secondary | ICD-10-CM | POA: Diagnosis not present

## 2023-06-09 DIAGNOSIS — G4733 Obstructive sleep apnea (adult) (pediatric): Secondary | ICD-10-CM | POA: Diagnosis not present

## 2023-07-04 DIAGNOSIS — G4733 Obstructive sleep apnea (adult) (pediatric): Secondary | ICD-10-CM | POA: Diagnosis not present

## 2023-07-08 DIAGNOSIS — G4733 Obstructive sleep apnea (adult) (pediatric): Secondary | ICD-10-CM | POA: Diagnosis not present

## 2023-08-08 DIAGNOSIS — G4733 Obstructive sleep apnea (adult) (pediatric): Secondary | ICD-10-CM | POA: Diagnosis not present

## 2023-09-07 DIAGNOSIS — G4733 Obstructive sleep apnea (adult) (pediatric): Secondary | ICD-10-CM | POA: Diagnosis not present

## 2023-11-18 ENCOUNTER — Other Ambulatory Visit (HOSPITAL_COMMUNITY): Payer: Self-pay

## 2023-11-19 ENCOUNTER — Other Ambulatory Visit: Payer: Self-pay | Admitting: Student

## 2024-01-12 DIAGNOSIS — G4733 Obstructive sleep apnea (adult) (pediatric): Secondary | ICD-10-CM | POA: Diagnosis not present

## 2024-02-01 ENCOUNTER — Other Ambulatory Visit: Payer: Self-pay

## 2024-02-03 ENCOUNTER — Other Ambulatory Visit (HOSPITAL_COMMUNITY): Payer: Self-pay

## 2024-02-05 IMAGING — MG DIGITAL DIAGNOSTIC BILAT W/ TOMO W/ CAD
6 of 10 series · 6 of 30 positions shown · non-contrast
Comparison: Previous exam(s).

CLINICAL DATA: Nonfocal bilateral breast pain

EXAM:
DIGITAL DIAGNOSTIC BILATERAL MAMMOGRAM WITH TOMOSYNTHESIS AND CAD
TECHNIQUE: Bilateral digital diagnostic mammography and breast tomosynthesis
was performed. The images were evaluated with computer-aided
detection.

[L MLO synth-2D]
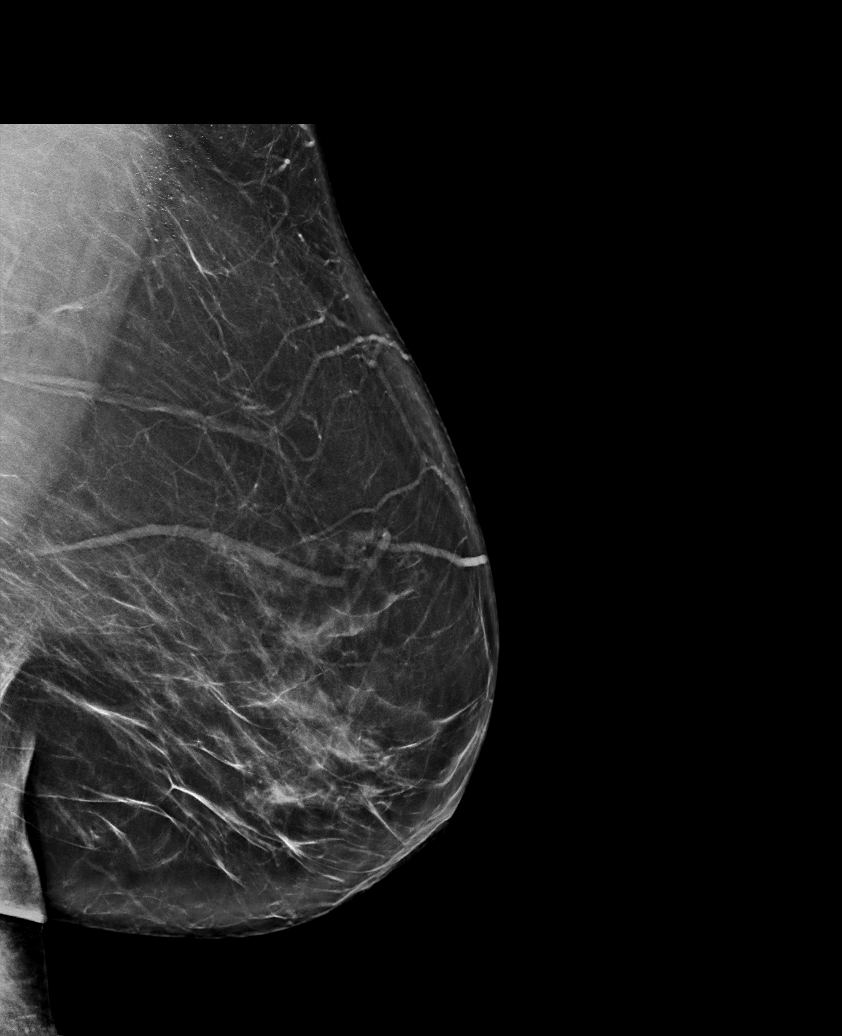

[L CC synth-2D (1 of 2)]
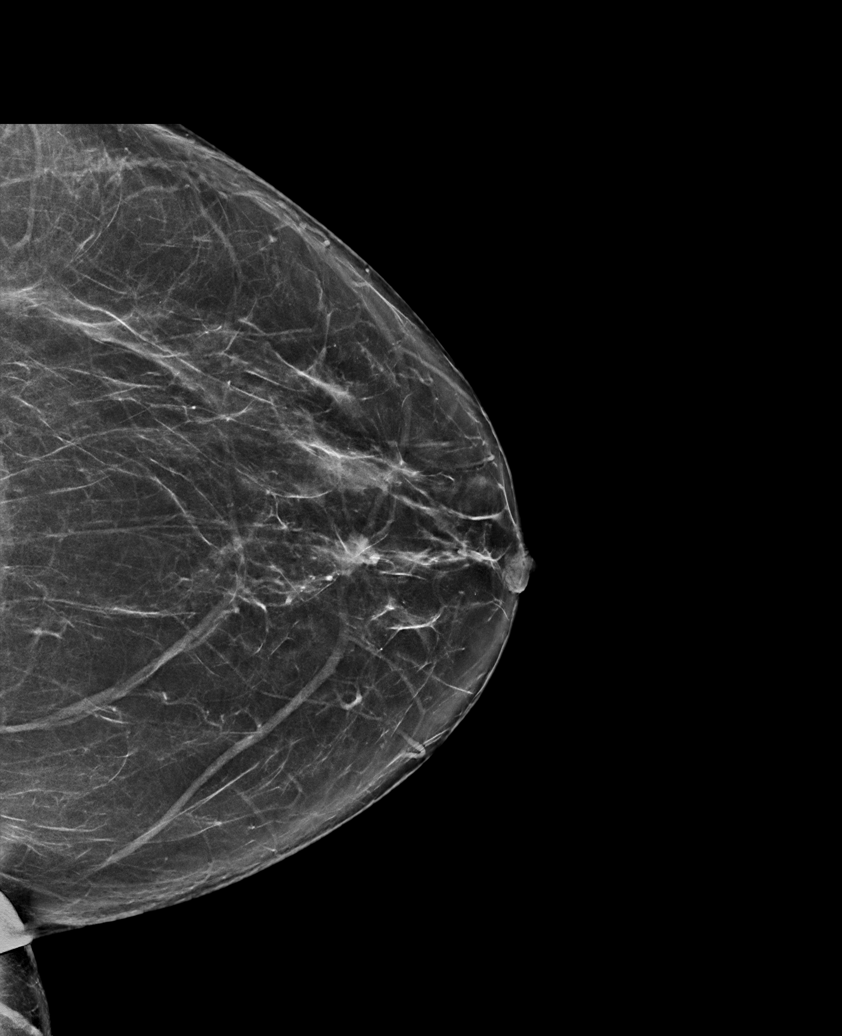

[R MLO synth-2D]
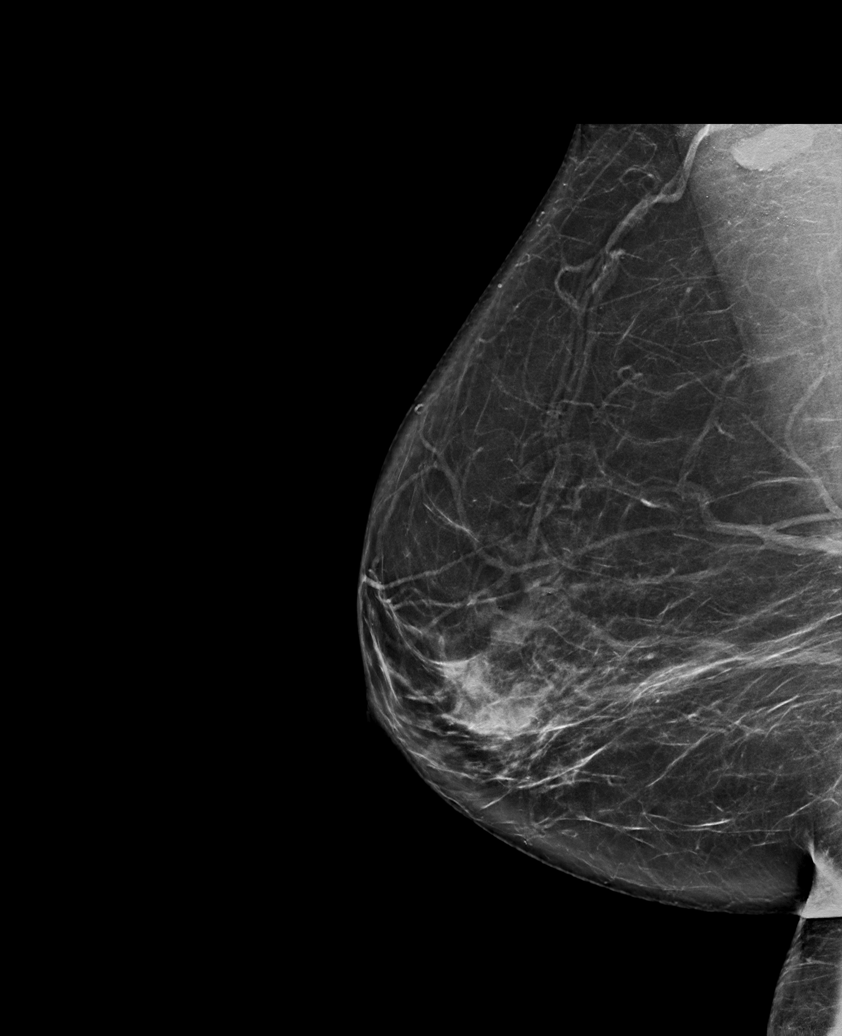

[L CC synth-2D (2 of 2)]
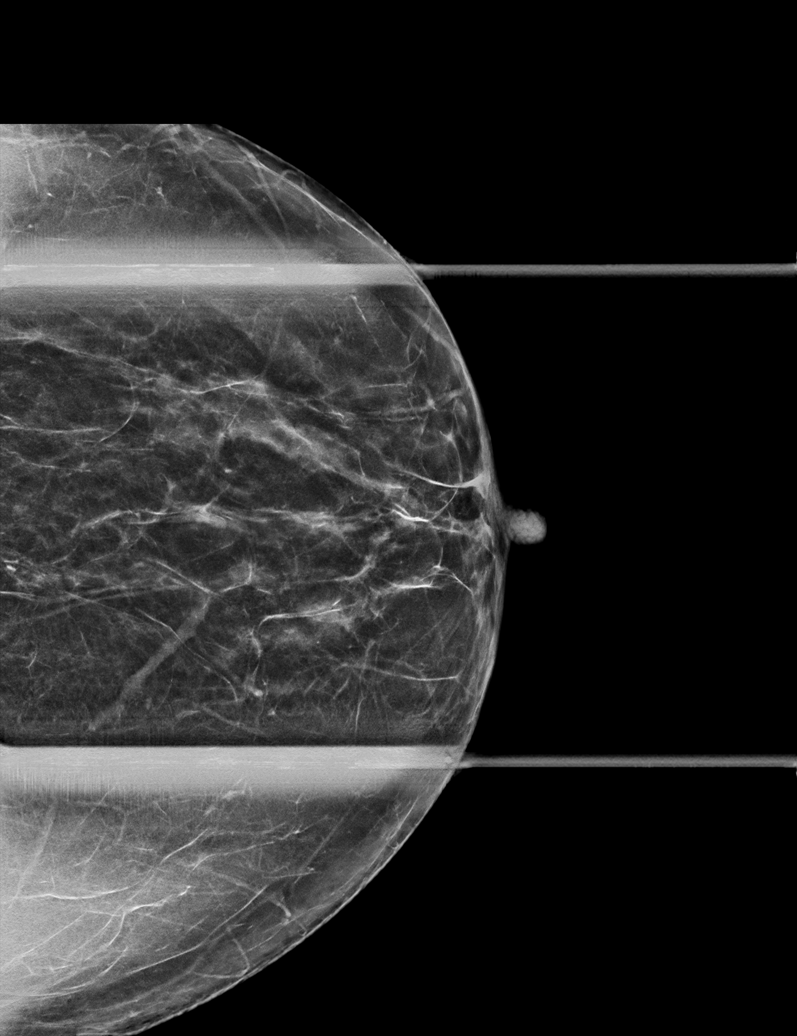

[R CC synth-2D]
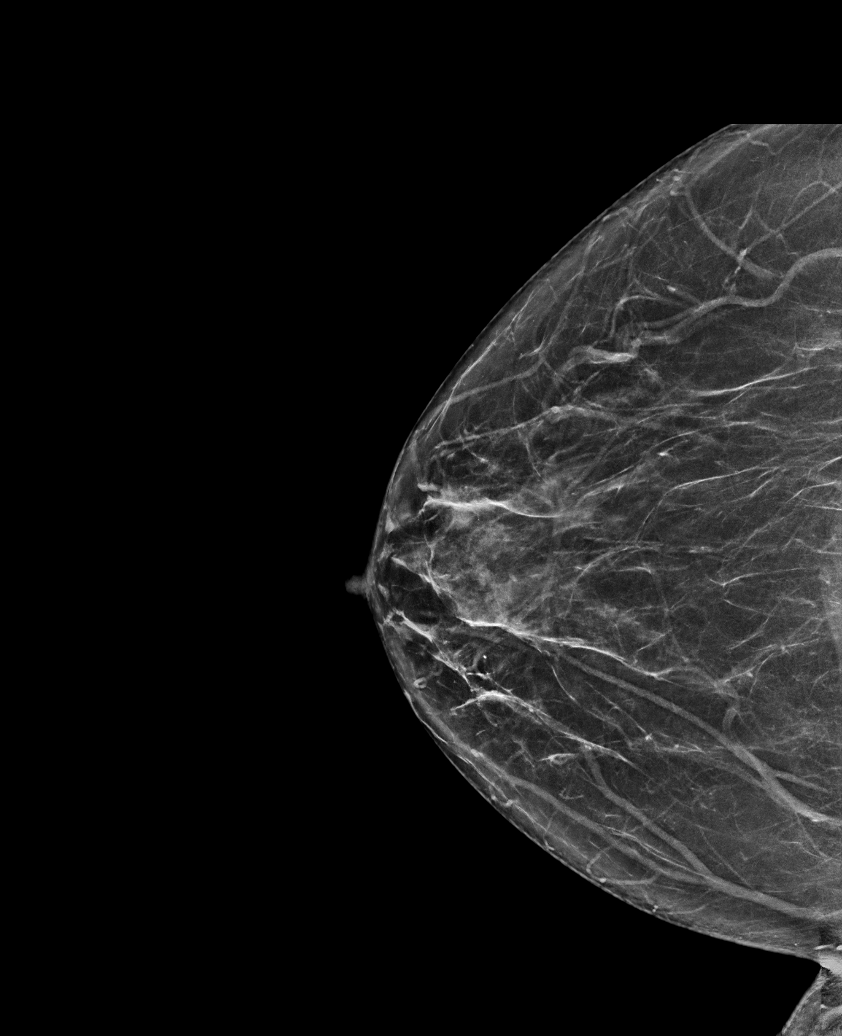

[R CC tomo · tomo slice 35/69.0]
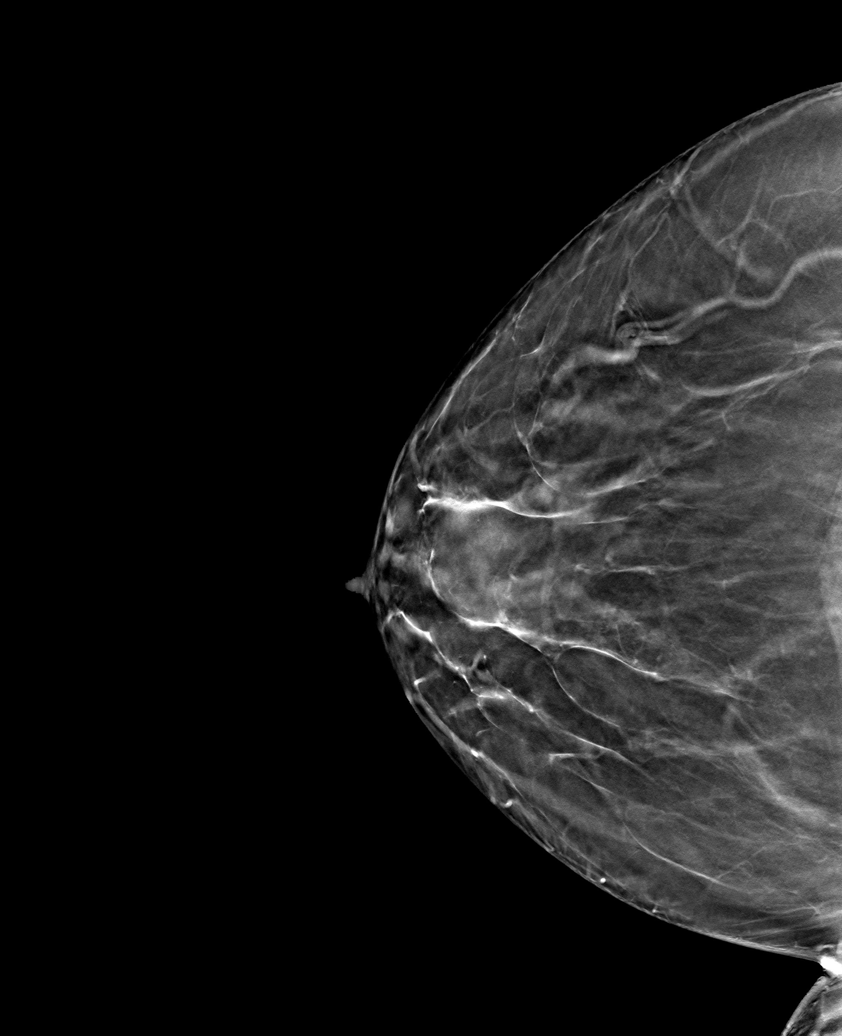

[6 of 30 positions shown; findings below may reference images not displayed]

ACR Breast Density Category b: There are scattered areas of
fibroglandular density.
FINDINGS: No suspicious masses, calcifications, or distortion are identified
in either breast.
IMPRESSION: No mammographic evidence of malignancy.

RECOMMENDATION:
Annual screening mammography. Treatment of the patient's symptoms
should be based on clinical and physical exam given lack of imaging
findings.

I have discussed the findings and recommendations with the patient.
If applicable, a reminder letter will be sent to the patient
regarding the next appointment.

BI-RADS CATEGORY  1: Negative.

## 2024-02-12 ENCOUNTER — Other Ambulatory Visit (HOSPITAL_COMMUNITY): Payer: Self-pay

## 2024-03-02 ENCOUNTER — Other Ambulatory Visit: Payer: Self-pay

## 2024-03-09 ENCOUNTER — Other Ambulatory Visit (HOSPITAL_COMMUNITY): Payer: Self-pay

## 2024-04-12 DIAGNOSIS — G4733 Obstructive sleep apnea (adult) (pediatric): Secondary | ICD-10-CM | POA: Diagnosis not present

## 2024-04-14 ENCOUNTER — Other Ambulatory Visit (HOSPITAL_COMMUNITY): Payer: Self-pay

## 2024-04-14 ENCOUNTER — Other Ambulatory Visit: Payer: Self-pay | Admitting: Student

## 2024-04-14 DIAGNOSIS — I10 Essential (primary) hypertension: Secondary | ICD-10-CM

## 2024-04-14 MED ORDER — AMLODIPINE BESYLATE 10 MG PO TABS
10.0000 mg | ORAL_TABLET | Freq: Every day | ORAL | 0 refills | Status: DC
Start: 2024-04-14 — End: 2024-05-10
  Filled 2024-04-14: qty 90, 90d supply, fill #0

## 2024-04-14 NOTE — Telephone Encounter (Signed)
 Copied from CRM [id number]. Topic: Clinical - Medication Refill >> Apr 14, 2024 11:24 AM Merlynn A wrote: Medication: amLODipine  (NORVASC ) 10 MG tablet  Has the patient contacted their pharmacy? Yes (Agent: If no, request that the patient contact the pharmacy for the refill. If patient does not wish to contact the pharmacy document the reason why and proceed with request.) (Agent: If yes, when and what did the pharmacy advise?)  Patient was advised that the refill request was denied. Patient does have upcoming appointment.   This is the patient's preferred pharmacy:  [place] [facility] [address] [place] [address] Phone: [phone] Fax: [phone]  Is this the correct pharmacy for this prescription? Yes If no, delete pharmacy and type the correct one.   Has the prescription been filled recently? No  Is the patient out of the medication? Yes  Has the patient been seen for an appointment in the last year OR does the patient have an upcoming appointment? Yes  Can we respond through MyChart? Yes  Agent: Please be advised that Rx refills may take up to 3 business days. We ask that you follow-up with your pharmacy.

## 2024-04-15 ENCOUNTER — Other Ambulatory Visit (HOSPITAL_COMMUNITY): Payer: Self-pay

## 2024-04-15 ENCOUNTER — Other Ambulatory Visit: Payer: Self-pay

## 2024-04-16 ENCOUNTER — Other Ambulatory Visit: Payer: Self-pay

## 2024-05-10 ENCOUNTER — Ambulatory Visit: Payer: Self-pay

## 2024-05-10 ENCOUNTER — Other Ambulatory Visit: Payer: Self-pay

## 2024-05-10 ENCOUNTER — Other Ambulatory Visit (HOSPITAL_COMMUNITY): Payer: Self-pay

## 2024-05-10 VITALS — BP 122/74 | HR 73 | Temp 98.4°F | Ht 66.0 in | Wt 217.8 lb

## 2024-05-10 DIAGNOSIS — R059 Cough, unspecified: Secondary | ICD-10-CM | POA: Diagnosis not present

## 2024-05-10 DIAGNOSIS — Z6835 Body mass index (BMI) 35.0-35.9, adult: Secondary | ICD-10-CM

## 2024-05-10 DIAGNOSIS — N951 Menopausal and female climacteric states: Secondary | ICD-10-CM | POA: Diagnosis not present

## 2024-05-10 DIAGNOSIS — R052 Subacute cough: Secondary | ICD-10-CM

## 2024-05-10 DIAGNOSIS — L219 Seborrheic dermatitis, unspecified: Secondary | ICD-10-CM | POA: Diagnosis not present

## 2024-05-10 DIAGNOSIS — I10 Essential (primary) hypertension: Secondary | ICD-10-CM | POA: Diagnosis not present

## 2024-05-10 DIAGNOSIS — G4733 Obstructive sleep apnea (adult) (pediatric): Secondary | ICD-10-CM

## 2024-05-10 DIAGNOSIS — Z1231 Encounter for screening mammogram for malignant neoplasm of breast: Secondary | ICD-10-CM

## 2024-05-10 DIAGNOSIS — Z0000 Encounter for general adult medical examination without abnormal findings: Secondary | ICD-10-CM

## 2024-05-10 MED ORDER — KETOCONAZOLE 2 % EX CREA
1.0000 | TOPICAL_CREAM | Freq: Two times a day (BID) | CUTANEOUS | 1 refills | Status: AC
  Filled 2024-05-10: qty 15, 8d supply, fill #0

## 2024-05-10 MED ORDER — KETOCONAZOLE 2 % EX SHAM
1.0000 | MEDICATED_SHAMPOO | CUTANEOUS | 1 refills | Status: AC
  Filled 2024-05-10: qty 120, 30d supply, fill #0

## 2024-05-10 MED ORDER — AMLODIPINE BESYLATE 10 MG PO TABS
10.0000 mg | ORAL_TABLET | Freq: Every day | ORAL | 4 refills | Status: AC
Start: 2024-05-10 — End: ?
  Filled 2024-05-10 – 2024-07-15 (×2): qty 90, 90d supply, fill #0

## 2024-05-10 NOTE — Assessment & Plan Note (Signed)
 Patient's blood pressure today 122/74.  Patient monitors her blood pressures at home and says her blood pressures have been stable.  Patient states she is tolerating amlodipine  well, did not appreciate bilateral lower extremity swelling on exam patient denies swelling.  We ordered amlodipine  prescription with enough refills to get her through the next year.

## 2024-05-10 NOTE — Assessment & Plan Note (Signed)
 Patient is still finding it difficult to use her CPAP machine regularly.  She is motivated to continue trying and is aware that it will help her sleep.  Discussed trying to find a mask that fits better or adding humidity.

## 2024-05-10 NOTE — Assessment & Plan Note (Signed)
 Patient presents Prentice and scalp and scalp itching.  Patient also has itchiness and redness around her nose.  Patient is not able to see a dermatologist until April of next year.  Discussed using ketoconazole  cream around the nasal alae routinely for 3 weeks, then only using as maintenance therapy to prevent relapse.  Discussed using ketoconazole  shampoo routinely for the next 2 months and then only using to control symptoms and prevent relapse.

## 2024-05-10 NOTE — Assessment & Plan Note (Signed)
 Patient had a hysterectomy but still has both ovaries. Patient has had increased insomnia due to hot flashes. Patient has mild vaginal dryness. Denies decrease in libido. Patient denies increased irritability or moodiness. Discussed the limited treatment options for thermoregulation. Encouraged fans while sleeping and decreasing thermostat at home. Discussed OTC inter-vaginal, non-hormonal, topical therapy, Replens. Patient interested in trying.

## 2024-05-10 NOTE — Patient Instructions (Addendum)
 It was wonderful seeing you today!   Please remember to...  1) for your cough, recommend trying an over-the-counter to her antihistamine such as Claritin, Zyrtec, or Allegra.  Take 1 a day for 2 to 3 weeks and see if it improves your cough.  2) for vaginal dryness, recommend an over-the-counter non-- hormonal vaginal moisturizer called Replens.  Apply intravaginally using the pre-- filled applicator provided.  Recommended applying every 3 days regardless of sexual activity.  3) for your seborrheic dermatitis, apply the ketoconazole  cream twice a day for 3 weeks around your nose.  Once the dryness and redness has improved, you can apply it less frequently to prevent relapse.  Use the ketoconazole  shampoo for your scalp, every 3 to 5 days rinse your hair with water and massage in the shampoo.  Let shampoo sit for 3 to 5 minutes and then rinse with water.  After using every 3 to 4 days for 8 weeks, use the shampoo only as needed to control dandruff and prevent recurrence.  4) we placed a referral for breast cancer screening mammography.  You will get a call to schedule this.  5) for your blood pressure, you have refills to get you through the next year.  Continue monitoring your blood pressure at home.  6) for your weight, continue making dietary changes to support weight loss and staying active to increase physical activity.  And don't forget to get your flu shot before the end of October!  If you have any questions please feel free to the call the clinic at anytime at [phone].  Have a blessed day,  Dr. Charmayne

## 2024-05-10 NOTE — Progress Notes (Signed)
 Established Patient Office Visit  Subjective   Patient ID: Erika Eaton, female    DOB: [birth year]  Age: 48 y.o. MRN: [id number]  Chief Complaint  Patient presents with   Annual Exam   Hypertension    Patient presenting for her yearly annual appointment.  Patient is still not using her CPAP machine enough, her blood pressures are stable and has had no problems with amlodipine .  Denies any more episodes of dizziness.  Today's visit, patient concerned about a cough that has been lingering for several months.  Patient also brings up issues with sleeping, mainly due to hot flashes.  Patient has some vaginal dryness.  Patient remarks that her grandmother had early menopause.  Patient is trying to lose weight and has a plan with her husband to eat well and exercise starting on September 1.  Patient has an itchy scalp but was unable to get a dermatology appointment until April.  Patient is due for a mammogram, she will get her flu shot at the hospital next month, she knows that it is time for colonoscopy but denies colonoscopy or Cologuard at this time.  Otherwise, patient is well with no other complaints.          Objective:     BP 122/74 (BP Location: Right Arm, Patient Position: Sitting, Cuff Size: Large)   Pulse 73   Temp 98.4 F (36.9 C) (Oral)   Ht 5' 6 (1.676 m)   Wt 217 lb 12.8 oz (98.8 kg)   LMP 10/08/2019 (Exact Date)   SpO2 98%   BMI 35.15 kg/m    Physical Exam Vitals reviewed.  Constitutional:      Appearance: Normal appearance. She is not ill-appearing.  HENT:     Head:     Comments: Diffuse scaling and hypopigmentation around hairline.  Hypopigmented macules along forehead.    Nose: No congestion.     Comments: Erythematous hypopigmentation surrounding bilateral nasal alae    Mouth/Throat:     Mouth: Mucous membranes are moist.     Pharynx: Oropharynx is clear.  Eyes:     Conjunctiva/sclera: Conjunctivae normal.  Cardiovascular:     Rate and Rhythm:  Normal rate and regular rhythm.     Heart sounds: Normal heart sounds.  Pulmonary:     Effort: Pulmonary effort is normal.     Breath sounds: Normal breath sounds.  Abdominal:     General: Bowel sounds are normal. There is no distension.     Palpations: Abdomen is soft. There is no mass.     Tenderness: There is no abdominal tenderness.     Hernia: No hernia is present.  Musculoskeletal:     Right lower leg: No edema.     Left lower leg: No edema.  Skin:    General: Skin is warm.     Comments: See head and nose exam above  Neurological:     General: No focal deficit present.     Mental Status: She is alert and oriented to person, place, and time.  Psychiatric:        Mood and Affect: Mood normal.        Behavior: Behavior normal.      No results found for any visits on 05/10/24.  Last lipids Lab Results  Component Value Date   CHOL 183 05/12/2023   HDL 47 05/12/2023   LDLCALC 119 (H) 05/12/2023   TRIG 93 05/12/2023   CHOLHDL 3.9 05/12/2023   Last hemoglobin A1c Lab Results  Component Value Date   HGBA1C 6.0 (H) 05/12/2023      The 10-year ASCVD risk score (Arnett DK, et al., 2019) is: 2.6%    Assessment & Plan:   Problem List Items Addressed This Visit       Cardiovascular and Mediastinum   Hypertension (Chronic)   Patient's blood pressure today 122/74.  Patient monitors her blood pressures at home and says her blood pressures have been stable.  Patient states she is tolerating amlodipine  well, did not appreciate bilateral lower extremity swelling on exam patient denies swelling.  We ordered amlodipine  prescription with enough refills to get her through the next year.      Relevant Medications   amLODipine  (NORVASC ) 10 MG tablet     Respiratory   Obstructive sleep apnea   Patient is still finding it difficult to use her CPAP machine regularly.  She is motivated to continue trying and is aware that it will help her sleep.  Discussed trying to find a mask  that fits better or adding humidity.        Musculoskeletal and Integument   Seborrheic dermatitis - Primary   Patient presents Prentice and scalp and scalp itching.  Patient also has itchiness and redness around her nose.  Patient is not able to see a dermatologist until April of next year.  Discussed using ketoconazole  cream around the nasal alae routinely for 3 weeks, then only using as maintenance therapy to prevent relapse.  Discussed using ketoconazole  shampoo routinely for the next 2 months and then only using to control symptoms and prevent relapse.        Other   Healthcare maintenance (Chronic)   Discussed with patient about ordering labs. Behavior modification would be first-line in the event of an elevated lipid panel profile or A1c. Decided not to order routine labs as patient described her weight loss plan to cut down on sodas and sugary drinks and sauce that contribute to excess calories.  Patient is also walking in the evenings with her husband for 30 minutes to increase physical activity.   Patient due for breast cancer screening, placed referral for breast cancer screening with mammography during this visit. Discussed colon cancer screening, both colonoscopy and Cologuard, patient not interested in doing this at this time.      Morbid obesity due to excess calories (HCC)   Patient's weight is stable from a year prior.  Patient is motivated to lose weight and has a plan with her husband to decrease calories and increase physical activity.  Discussed that weight loss will improve her sleep apnea and potentially aid in helping her sleep.      Cough   Patient has had a nonproductive cough more a clearing of the throat, for several months. Cough is not exacerbated by allergies, environment, animals, or worse in certain buildings. 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 Patient notices the cough more at night but cough is present  all the time. Denies chest pain or shortness of breath associated with cough. Patient has heartburn when she eats certain foods although cough is not worse when she eats or when she is having reflux. Discussed trying an antihistamine daily to see if this improves cough. Recommend follow-up if cough does not improve.       Perimenopausal symptoms   Patient had a hysterectomy but still has both ovaries. Patient has had increased insomnia due to hot flashes. Patient has mild vaginal dryness.  Denies decrease in libido. Patient denies increased irritability or moodiness. Discussed the limited treatment options for thermoregulation. Encouraged fans while sleeping and decreasing thermostat at home. Discussed OTC inter-vaginal, non-hormonal, topical therapy, Replens. Patient interested in trying.       Other Visit Diagnoses       Breast cancer screening by mammogram       Relevant Orders   MM 3D SCREENING MAMMOGRAM BILATERAL BREAST       Return in about 1 year (around 05/10/2025) for annual appointment.    Viktoria King, DO

## 2024-05-10 NOTE — Assessment & Plan Note (Signed)
 Patient's weight is stable from a year prior.  Patient is motivated to lose weight and has a plan with her husband to decrease calories and increase physical activity.  Discussed that weight loss will improve her sleep apnea and potentially aid in helping her sleep.

## 2024-05-10 NOTE — Assessment & Plan Note (Addendum)
 Discussed with patient about ordering labs. Behavior modification would be first-line in the event of an elevated lipid panel profile or A1c. Decided not to order routine labs as patient described her weight loss plan to cut down on sodas and sugary drinks and sauce that contribute to excess calories.  Patient is also walking in the evenings with her husband for 30 minutes to increase physical activity.   Patient due for breast cancer screening, placed referral for breast cancer screening with mammography during this visit. Discussed colon cancer screening, both colonoscopy and Cologuard, patient not interested in doing this at this time.

## 2024-05-10 NOTE — Assessment & Plan Note (Signed)
 Patient has had a nonproductive cough more a clearing of the throat, for several months. Cough is not exacerbated by allergies, environment, animals, or worse in certain buildings. 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 Patient notices the cough more at night but cough is present all the time. Denies chest pain or shortness of breath associated with cough. Patient has heartburn when she eats certain foods although cough is not worse when she eats or when she is having reflux. Discussed trying an antihistamine daily to see if this improves cough. Recommend follow-up if cough does not improve.

## 2024-05-21 NOTE — Progress Notes (Signed)
 Internal Medicine Clinic Attending  Case discussed with the resident at the time of the visit.  We reviewed the resident's history and exam and pertinent patient test results.  I agree with the assessment, diagnosis, and plan of care documented in the resident's note.

## 2024-06-04 ENCOUNTER — Ambulatory Visit
Admission: RE | Admit: 2024-06-04 | Discharge: 2024-06-04 | Disposition: A | Discharge status: Home / Self Care | Source: Ambulatory Visit | Attending: Internal Medicine | Admitting: Internal Medicine

## 2024-06-04 DIAGNOSIS — Z1231 Encounter for screening mammogram for malignant neoplasm of breast: Secondary | ICD-10-CM | POA: Diagnosis not present

## 2024-06-09 ENCOUNTER — Other Ambulatory Visit: Payer: Self-pay | Admitting: Internal Medicine

## 2024-06-09 DIAGNOSIS — R928 Other abnormal and inconclusive findings on diagnostic imaging of breast: Secondary | ICD-10-CM

## 2024-06-09 DIAGNOSIS — N6489 Other specified disorders of breast: Secondary | ICD-10-CM

## 2024-06-10 ENCOUNTER — Ambulatory Visit
Admission: RE | Admit: 2024-06-10 | Discharge: 2024-06-10 | Disposition: A | Discharge status: Home / Self Care | Source: Ambulatory Visit | Attending: Internal Medicine | Admitting: Internal Medicine

## 2024-06-10 DIAGNOSIS — N6489 Other specified disorders of breast: Secondary | ICD-10-CM

## 2024-06-10 DIAGNOSIS — R928 Other abnormal and inconclusive findings on diagnostic imaging of breast: Secondary | ICD-10-CM

## 2024-06-10 DIAGNOSIS — R92322 Mammographic fibroglandular density, left breast: Secondary | ICD-10-CM | POA: Diagnosis not present

## 2024-07-15 ENCOUNTER — Other Ambulatory Visit (HOSPITAL_COMMUNITY): Payer: Self-pay
